# Patient Record
Sex: Male | Born: 1945
Health system: Southern US, Community
[De-identification: ages and names within clinical notes are randomized; demographics above are authoritative.]

## PROBLEM LIST (undated history)

## (undated) DIAGNOSIS — I10 Essential (primary) hypertension: Secondary | ICD-10-CM

## (undated) DIAGNOSIS — T7840XA Allergy, unspecified, initial encounter: Secondary | ICD-10-CM

## (undated) DIAGNOSIS — M1990 Unspecified osteoarthritis, unspecified site: Secondary | ICD-10-CM

## (undated) HISTORY — DX: Unspecified osteoarthritis, unspecified site: M19.90

## (undated) HISTORY — DX: Essential (primary) hypertension: I10

## (undated) HISTORY — DX: Allergy, unspecified, initial encounter: T78.40XA

---

## 2002-03-28 ENCOUNTER — Encounter: Payer: Self-pay | Admitting: Emergency Medicine

## 2002-03-28 ENCOUNTER — Emergency Department (HOSPITAL_COMMUNITY): Admission: EM | Admit: 2002-03-28 | Discharge: 2002-03-28 | Payer: Self-pay | Admitting: Emergency Medicine

## 2007-05-26 ENCOUNTER — Ambulatory Visit: Payer: Self-pay | Admitting: Gastroenterology

## 2007-05-31 ENCOUNTER — Ambulatory Visit: Payer: Self-pay | Admitting: Gastroenterology

## 2007-05-31 ENCOUNTER — Encounter: Payer: Self-pay | Admitting: Gastroenterology

## 2007-05-31 DIAGNOSIS — D126 Benign neoplasm of colon, unspecified: Secondary | ICD-10-CM | POA: Insufficient documentation

## 2007-05-31 DIAGNOSIS — K648 Other hemorrhoids: Secondary | ICD-10-CM | POA: Insufficient documentation

## 2007-06-21 DIAGNOSIS — K625 Hemorrhage of anus and rectum: Secondary | ICD-10-CM

## 2007-06-21 DIAGNOSIS — K5730 Diverticulosis of large intestine without perforation or abscess without bleeding: Secondary | ICD-10-CM | POA: Insufficient documentation

## 2007-06-21 DIAGNOSIS — E7800 Pure hypercholesterolemia, unspecified: Secondary | ICD-10-CM

## 2007-06-21 DIAGNOSIS — M129 Arthropathy, unspecified: Secondary | ICD-10-CM | POA: Insufficient documentation

## 2007-06-21 DIAGNOSIS — G4730 Sleep apnea, unspecified: Secondary | ICD-10-CM | POA: Insufficient documentation

## 2010-11-03 NOTE — Assessment & Plan Note (Signed)
[facility]                         GASTROENTEROLOGY OFFICE NOTE   EARLEY, GROBE                        MRN:          [id number]  DATE:05/26/2007                            DOB:          Aug 15, 1945    PROBLEM:  A 65 year old black male with asymptomatic rectal bleeding  with the last 5 days.   ASSESSMENT:  Dale Hopkins most likely is having hemorrhoidal bleeding, but  he does have blood and mucus from his rectal exam, suggesting a possible  rectal process.  It is of note that the patient had a negative  colonoscopy exam, except for diverticulosis in August of 2005.  He  recently had CBC by Dr. Nicholos Johns earlier this week and showed a normal  hemoglobin.   RECOMMENDATIONS:  1. Sitz baths twice a day.  2. Analpram cream twice a day, along with Anusol HC suppositories.  3. Colace 100 mg twice a day.  4. Hold aspirin and will check colonoscopy as soon as possible.  5. Continue other medications per Dr. Nicholos Johns, which include HCTZ 25 mg      a day.   HISTORY OF PRESENT ILLNESS:  Dale Hopkins has been doing well, until  earlier this week when he started having some asymptomatic bright red  blood per rectum with wiping and on his toilet paper.  He felt what  sounds like a possible prolapsed hemorrhoid, which has been reduced.  He  really denies rectal pain, abdominal pain, or any symptoms of  hypovolemia.  He denies upper GI or hepatobiliary complaints and gives  no known history of peptic ulcer disease.  Does not use NSAIDs but is on  a daily aspirin tablet.  Follows a regular diet and generally does not  have to strain to go to the bathroom.   PAST MEDICAL HISTORY:  Remarkable for hypercholesterolemia, degenerative  arthritis, and sleep apnea.   MEDICATIONS:  Aspirin 81 mg a day and HCTZ 25 mg a day.   FAMILY HISTORY:  Entirely noncontributory.   SOCIAL HISTORY:  He is married and lives with his wife.  He has a 7th  grade education, works as a  Arboriculturist at [employer].  He quite  smoking and drinking 24 years ago.   REVIEW OF SYSTEMS:  Noncontributory.   EXAMINATION:  GENERAL:  He is a healthy-appearing black male in no  distress, appearing his stated age.  He is 5' 8 and weighs 202 pounds.  VITAL SIGNS:  Blood pressure 130/86 and pulse was 68 and regular.  CHEST:  I could not appreciate stigmata of chronic liver disease.  His  chest was clear.  HEART:  He was in a regular rhythm, without murmurs, gallops or rubs.  ABDOMEN:  I could not appreciate hepatosplenomegaly, abdominal  masses  or tenderness.  Bowel sounds were normal.  PERIPHERAL:  Extremities were unremarkable.  MENTAL STATUS:  Clear.  RECTAL:  Unremarkable.  Rectal exam did show a partially thrombosed  hemorrhoid on the left lateral anal canal.  There was, however, some  blood and a mucusy substance in his rectum that was markedly guaiac-  positive.     Vania Rea. Jarold Motto, MD, Caleen Essex, FAGA  Electronically Signed    DRP/MedQ  DD: 05/26/2007  DT: 05/26/2007  Job #: [id number]

## 2012-01-14 ENCOUNTER — Telehealth: Payer: Self-pay | Admitting: Hematology and Oncology

## 2012-01-14 NOTE — Telephone Encounter (Signed)
S/w wife re appt for 8/2 @ 9:30 am.

## 2012-01-17 ENCOUNTER — Telehealth: Payer: Self-pay | Admitting: Internal Medicine

## 2012-01-17 NOTE — Telephone Encounter (Signed)
Referred by Dr. Tally Joe Dx- Thrombocytopenia. NP packet mailed out.

## 2012-01-21 ENCOUNTER — Telehealth: Payer: Self-pay | Admitting: Hematology and Oncology

## 2012-01-21 ENCOUNTER — Encounter: Payer: Self-pay | Admitting: Hematology and Oncology

## 2012-01-21 ENCOUNTER — Ambulatory Visit: Payer: BC Managed Care – PPO

## 2012-01-21 ENCOUNTER — Ambulatory Visit (HOSPITAL_BASED_OUTPATIENT_CLINIC_OR_DEPARTMENT_OTHER): Payer: BC Managed Care – PPO | Admitting: Hematology and Oncology

## 2012-01-21 ENCOUNTER — Ambulatory Visit (HOSPITAL_BASED_OUTPATIENT_CLINIC_OR_DEPARTMENT_OTHER): Payer: BC Managed Care – PPO | Admitting: Lab

## 2012-01-21 VITALS — BP 128/78 | HR 68 | Temp 98.6°F | Resp 18 | Ht 68.0 in | Wt 202.4 lb

## 2012-01-21 DIAGNOSIS — D696 Thrombocytopenia, unspecified: Secondary | ICD-10-CM

## 2012-01-21 DIAGNOSIS — D72819 Decreased white blood cell count, unspecified: Secondary | ICD-10-CM

## 2012-01-21 DIAGNOSIS — R5383 Other fatigue: Secondary | ICD-10-CM

## 2012-01-21 DIAGNOSIS — R5381 Other malaise: Secondary | ICD-10-CM

## 2012-01-21 LAB — CBC WITH DIFFERENTIAL/PLATELET
BASO%: 0.7 % (ref 0.0–2.0)
EOS%: 11.5 % — ABNORMAL HIGH (ref 0.0–7.0)
Eosinophils Absolute: 0.3 10*3/uL (ref 0.0–0.5)
LYMPH%: 55.4 % — ABNORMAL HIGH (ref 14.0–49.0)
MCH: 28.9 pg (ref 27.2–33.4)
MCHC: 33.6 g/dL (ref 32.0–36.0)
MCV: 86.1 fL (ref 79.3–98.0)
MONO%: 8 % (ref 0.0–14.0)
Platelets: 130 10*3/uL — ABNORMAL LOW (ref 140–400)
RBC: 5.01 10*6/uL (ref 4.20–5.82)
RDW: 13.8 % (ref 11.0–14.6)

## 2012-01-21 LAB — MORPHOLOGY: RBC Comments: NORMAL

## 2012-01-21 NOTE — Progress Notes (Signed)
This office note has been dictated.

## 2012-01-21 NOTE — Patient Instructions (Addendum)
Patient to follow up as instructed.   No current outpatient prescriptions on file.        August 2013  Sunday Monday Tuesday Wednesday Thursday Friday Saturday               1   2   FINANCIAL COUNSELING   9:30 AM  (30 min.)  [facility]  [facility] MEDICAL ONCOLOGY   NEW PATIENT 60  10:00 AM  (60 min.)  Laurice Record, MD  [facility] MEDICAL ONCOLOGY 3    4   5   6   7   8   9   10    11   12   13   14   15   16   17    18   19   20   21   22   23   24    25   26   27   28   29   30    31

## 2012-01-21 NOTE — Telephone Encounter (Signed)
Gave pt appt for U/S next week , Dale Hopkins aware of appt for precert. Pt will; see MD  On 8/21/ per MD

## 2012-01-21 NOTE — Progress Notes (Signed)
CC:   Dale Hopkins, M.D.  IDENTIFYING STATEMENT:  The patient is a 66 year old man seen at request of Dr. Azucena Cecil with thrombocytopenia.  HISTORY OF PRESENT ILLNESS:  The patient was noted to have declining white blood cells and platelets during recent physicals.  Review of the patient's blood work notes the following:  01/05/2012 white cell count 2.9, hemoglobin 14.5, hematocrit 44.5, platelets 124; 04/19/2011 white cell count 3.7, hemoglobin 15.5, hematocrit 46.1, platelets 129; 03/05/2010 white cell count 3.4, hemoglobin 15.1, platelets 153.  On 03/04/2009 white cell count 3.2, hemoglobin 14.9, platelets 173.  The patient has not had any new addition to his medication list.  He denies over-the-counter herbal use. He has not lost any weight.  Denies recent infections.  Denies bleeding. He denies adenopathy.  PAST MEDICAL HISTORY: 1. Sleep apnea. 2. Mixed hyperlipidemia. 3. Hypertension. 4. Allergic rhinitis. 5. Erectile dysfunction.  MEDICATIONS: 1. Aspirin 21 mg daily. 2. B12 100 mcg sublingual daily. 3. Fish oil. 4. Micardis 80/25 one tablet daily. 5. Multivitamin. 6. Viagra 50 mg daily.  ALLERGIES:  None.  SOCIAL HISTORY:  The patient is married with 4 daughters.  He was a previous cigarette smoker, quit in 1989, smoked 1 pack a day for 25 years.  Denies alcohol use.  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  FAMILY HISTORY:  Negative for oncologic or hematologic disorders.  REVIEW OF SYSTEMS:  Denies fever, chills, night sweats, anorexia, weight loss.  Cardiovascular:  Denies chest pain, PND, orthopnea, ankle swelling.  Respiration:  Denies cough, hemoptysis, wheeze, shortness of breath.  GI:  Denies nausea, vomiting, abdominal pain, diarrhea, melena, hematochezia.  GU:  Denies dysuria, hematuria, nocturia, frequency. Musculoskeletal:  Denies joint aches, muscle pains.  Neurologic:  Denies headache, vision change, extremity weakness.  Rest of  review of systems negative.  PHYSICAL EXAM:  General:  The patient is a well-appearing, well- nourished man in no distress.  Vitals:  Pulse 68, blood pressure 128/78, temperature 98.6, respirations 18, weight 202.7 pounds.  HEENT:  Head is atraumatic, normocephalic.  Sclerae anicteric.  Mouth moist.  Neck: Supple.  Chest:  Clear to percussion and auscultation.  CVS: Unremarkable.  Abdomen:  Soft, nontender.  Bowel sounds present. Extremities:  No edema.  Lymph nodes:  No palpable cervical, axillary adenopathy.  CNS:  Nonfocal.  IMPRESSION AND PLAN:  The patient is a 65 year old man with gradually declining white blood cells and platelets.  Besides a little fatigue he is asymptomatic in terms of he has had no recent infections, bruising or bleeding.  So with this said the patient will proceed by repeating a CBC with diff.  We will review morphology.  If we confirm similar results we will have him proceed with a serum protein electrophoresis.  Will review morphology.  Will obtain vitamin B12 level as he appears to be on B12 replacement therapy sublingually.  We will obtain an ultrasound of the spleen to evaluate size to rule out peripheral destructive effect.  He admits to not having had an HIV test in years.  He has agreed to have this performed.  He returns to discuss results.  I spent more than half the time coordinating care.  Will also consider a bone marrow biopsy but he will await confirmatory blood counts.    ______________________________ Laurice Record, M.D. LIO/MEDQ  D:  01/21/2012  T:  01/21/2012  Job:  [id number]

## 2012-01-21 NOTE — Progress Notes (Signed)
New patient today accompanied by his wife, patient and wife met with advocate today at this time not in need of assistance. Patient and wife were a very pleasant couple.

## 2012-01-25 ENCOUNTER — Ambulatory Visit (HOSPITAL_COMMUNITY)
Admission: RE | Admit: 2012-01-25 | Discharge: 2012-01-25 | Disposition: A | Discharge status: Home / Self Care | Payer: BC Managed Care – PPO | Source: Ambulatory Visit | Attending: Hematology and Oncology | Admitting: Hematology and Oncology

## 2012-01-25 ENCOUNTER — Other Ambulatory Visit: Payer: Self-pay | Admitting: Hematology and Oncology

## 2012-01-25 DIAGNOSIS — R5383 Other fatigue: Secondary | ICD-10-CM

## 2012-01-25 DIAGNOSIS — D696 Thrombocytopenia, unspecified: Secondary | ICD-10-CM

## 2012-01-25 DIAGNOSIS — R5381 Other malaise: Secondary | ICD-10-CM | POA: Insufficient documentation

## 2012-01-25 LAB — BASIC METABOLIC PANEL
BUN: 15 mg/dL (ref 6–23)
CO2: 26 mEq/L (ref 19–32)
Calcium: 9.1 mg/dL (ref 8.4–10.5)
Glucose, Bld: 73 mg/dL (ref 70–99)
Sodium: 141 mEq/L (ref 135–145)

## 2012-01-25 LAB — PROTEIN ELECTROPHORESIS, SERUM
Alpha-1-Globulin: 2.6 % — ABNORMAL LOW (ref 2.9–4.9)
Alpha-2-Globulin: 7.9 % (ref 7.1–11.8)
Beta Globulin: 5.6 % (ref 4.7–7.2)
Gamma Globulin: 22.2 % — ABNORMAL HIGH (ref 11.1–18.8)

## 2012-01-25 LAB — VITAMIN B12: Vitamin B-12: 1345 pg/mL — ABNORMAL HIGH (ref 211–911)

## 2012-01-25 LAB — HIV ANTIBODY (ROUTINE TESTING W REFLEX): HIV: NONREACTIVE

## 2012-02-09 ENCOUNTER — Ambulatory Visit (HOSPITAL_BASED_OUTPATIENT_CLINIC_OR_DEPARTMENT_OTHER): Payer: BC Managed Care – PPO | Admitting: Hematology and Oncology

## 2012-02-09 ENCOUNTER — Encounter: Payer: Self-pay | Admitting: Hematology and Oncology

## 2012-02-09 ENCOUNTER — Telehealth: Payer: Self-pay | Admitting: Hematology and Oncology

## 2012-02-09 VITALS — BP 120/77 | HR 72 | Temp 97.0°F | Resp 20 | Ht 68.0 in | Wt 200.9 lb

## 2012-02-09 DIAGNOSIS — D696 Thrombocytopenia, unspecified: Secondary | ICD-10-CM

## 2012-02-09 DIAGNOSIS — D72819 Decreased white blood cell count, unspecified: Secondary | ICD-10-CM

## 2012-02-09 NOTE — Progress Notes (Signed)
This office note has been dictated.

## 2012-02-09 NOTE — Progress Notes (Signed)
CC:   Tally Joe, M.D.  IDENTIFYING STATEMENT:  The patient is a 66 year old man who presents to discuss results.  INTERVAL HISTORY:  In summary, the patient was referred because of declining white blood cells and thrombocytopenia during routine physicals.  He was asymptomatic.  Had denied any recent infections or bleeding.  He also denied B-symptoms or taking over-the-counter herbal remedies.  Results note the following:  An ultrasound of the abdomen on 01/25/2012 had shown a normal sized spleen.  White cell count 2.8, hemoglobin 14.5, hematocrit 43.1, platelets 130, ANC 700.  Review of peripheral smear showed occasional variant lymphocytes, otherwise unremarkable; vitamin B12 was 1345; serum protein electrophoresis did not detect an M-spike and IFE showed a nonspecific diffuse polyclonal type; ANCA screen was negative; HIV was nonreactive.  MEDICATIONS:  Reviewed and updated.  ALLERGIES:  None.  PAST MEDICAL HISTORY/FAMILY HISTORY/SOCIAL HISTORY:  Unchanged.  REVIEW OF SYSTEMS:  A 10-point review of systems was negative.  PHYSICAL EXAMINATION:  General:  The patient is alert and oriented x3. Vitals:  Pulse 68, blood pressure 128/78, temperature 98.6, respirations 18, weight 202 pounds.  HEENT:  Head is head is atraumatic, normocephalic.  Sclerae are anicteric.  Mouth moist.  Neck:  Supple. Chest/CVS:  Unremarkable.  Abdomen:  Soft, nontender.  Bowel sounds present.  Extremities:  No edema.  LABORATORY DATA:  As above.  In addition, sodium 141, potassium 3.7, chloride 103, CO2 is 26, BUN 15, creatinine 0.83, glucose 73.  IMPRESSION AND PLAN:  The patient is a 66 year old man with leukopenia and thrombocytopenia.  Lab workup and ultrasound so far are unremarkable.  The patient needs to be investigated further with a bone marrow biopsy with aspiration.  We discussed the logistics of the procedure.  The patient is willing to proceed and will be scheduled within the next  2 weeks.  He follows up thereafter to discuss results.    ______________________________ Laurice Record, M.D. LIO/MEDQ  D:  02/09/2012  T:  02/09/2012  Job:  [id number]

## 2012-02-09 NOTE — Telephone Encounter (Signed)
appts made and printed for pt aom °

## 2012-02-09 NOTE — Patient Instructions (Signed)
Dale Hopkins  [id number]  [facility] Discharge Instructions  RECOMMENDATIONS MADE BY THE CONSULTANT AND ANY TEST RESULTS WILL BE SENT TO YOUR REFERRING DOCTOR.   EXAM FINDINGS BY MD TODAY AND SIGNS AND SYMPTOMS TO REPORT TO CLINIC OR PRIMARY MD:   Your current list of medications are: Current Outpatient Prescriptions  Medication Sig Dispense Refill  . aspirin 81 MG tablet Take 81 mg by mouth daily.      . Cyanocobalamin (VITAMIN B-12) 1000 MCG SUBL Place 1 tablet under the tongue daily.      . fish oil-omega-3 fatty acids 1000 MG capsule Take 1 g by mouth daily.      Marland Kitchen MICARDIS HCT 80-25 MG per tablet Take 1 tablet by mouth Daily.      . Multiple Vitamin (MULTIVITAMIN) tablet Take 1 tablet by mouth daily.      . sildenafil (VIAGRA) 100 MG tablet Take 50 mg by mouth daily as needed.      Marland Kitchen UNKNOWN TO PATIENT 1 tablet by Per post-pyloric tube route as needed. Sinus Aid- takes as needed for allergies- pt unsure of exact name or active ingredient         INSTRUCTIONS GIVEN AND DISCUSSED:   SPECIAL INSTRUCTIONS/FOLLOW-UP:  See above.  I acknowledge that I have been informed and understand all the instructions given to me and received a copy. I do not have any more questions at this time, but understand that I may call the [facility] at [phone] during business hours should I have any further questions or need assistance in obtaining follow-up care.

## 2012-02-17 ENCOUNTER — Other Ambulatory Visit (HOSPITAL_COMMUNITY)
Admission: RE | Admit: 2012-02-17 | Discharge: 2012-02-17 | Disposition: A | Discharge status: Home / Self Care | Payer: Medicare Other | Source: Ambulatory Visit | Attending: Hematology and Oncology | Admitting: Hematology and Oncology

## 2012-02-17 ENCOUNTER — Other Ambulatory Visit: Payer: BC Managed Care – PPO

## 2012-02-17 ENCOUNTER — Ambulatory Visit (HOSPITAL_BASED_OUTPATIENT_CLINIC_OR_DEPARTMENT_OTHER): Payer: BC Managed Care – PPO | Admitting: Hematology and Oncology

## 2012-02-17 VITALS — BP 133/83 | HR 68 | Temp 97.9°F

## 2012-02-17 DIAGNOSIS — D72819 Decreased white blood cell count, unspecified: Secondary | ICD-10-CM

## 2012-02-17 DIAGNOSIS — D696 Thrombocytopenia, unspecified: Secondary | ICD-10-CM | POA: Insufficient documentation

## 2012-02-17 DIAGNOSIS — D72822 Plasmacytosis: Secondary | ICD-10-CM | POA: Insufficient documentation

## 2012-02-17 NOTE — Progress Notes (Signed)
1000- Dressing to R bmbx site CDI.  Pt has no complaints.  Pt already given instructions to call office if any problems (bleeding uncontrolled by pressure placed to site for 20-30 minutes, numbness/tingling, severe pain), and  may take tylenol prn for pain.  Pt verbalizes understanding.

## 2012-02-17 NOTE — Procedures (Signed)
BONE MARROW PROCEDURE NOTE  Following consent, the right skin and iliac crest were cleaned and anesthestized with 2% lidocaine.  A bone marrow and biopsy were performed on separate sites of the right iliac crest.  The patient tolerated the procedure well.  The were no complications.  The area was pressure dressed.  The patient was told to keep the area clean and dry for at least 24 hours.  She was to report excessive bleeding. The reason for the bone marrow was to evaluate leucopenia.  The patient follows up to discuss results.

## 2012-03-03 ENCOUNTER — Encounter: Payer: Self-pay | Admitting: Hematology and Oncology

## 2012-03-03 ENCOUNTER — Telehealth: Payer: Self-pay | Admitting: Hematology and Oncology

## 2012-03-03 ENCOUNTER — Ambulatory Visit (HOSPITAL_BASED_OUTPATIENT_CLINIC_OR_DEPARTMENT_OTHER): Payer: BC Managed Care – PPO | Admitting: Hematology and Oncology

## 2012-03-03 ENCOUNTER — Other Ambulatory Visit (HOSPITAL_BASED_OUTPATIENT_CLINIC_OR_DEPARTMENT_OTHER): Payer: BC Managed Care – PPO | Admitting: Lab

## 2012-03-03 VITALS — BP 148/80 | HR 75 | Temp 97.1°F | Resp 20 | Ht 68.0 in | Wt 203.0 lb

## 2012-03-03 DIAGNOSIS — D72829 Elevated white blood cell count, unspecified: Secondary | ICD-10-CM

## 2012-03-03 DIAGNOSIS — D696 Thrombocytopenia, unspecified: Secondary | ICD-10-CM

## 2012-03-03 DIAGNOSIS — D72819 Decreased white blood cell count, unspecified: Secondary | ICD-10-CM

## 2012-03-03 LAB — CBC WITH DIFFERENTIAL/PLATELET
BASO%: 1 % (ref 0.0–2.0)
HCT: 40.6 % (ref 38.4–49.9)
LYMPH%: 61.7 % — ABNORMAL HIGH (ref 14.0–49.0)
MCHC: 33.1 g/dL (ref 32.0–36.0)
MCV: 86.8 fL (ref 79.3–98.0)
MONO#: 0.3 10*3/uL (ref 0.1–0.9)
MONO%: 8.1 % (ref 0.0–14.0)
NEUT%: 20.2 % — ABNORMAL LOW (ref 39.0–75.0)
Platelets: 132 10*3/uL — ABNORMAL LOW (ref 140–400)
WBC: 3.1 10*3/uL — ABNORMAL LOW (ref 4.0–10.3)

## 2012-03-03 NOTE — Patient Instructions (Addendum)
Dale Hopkins  [id number]   [facility] - AFTER VISIT SUMMARY   **RECOMMENDATIONS MADE BY THE CONSULTANT AND ANY TEST    RESULTS WILL BE SENT TO YOUR REFERRING DOCTORS.   YOUR EXAM FINDINGS, LABS AND RESULTS WERE DISCUSSED BY YOUR MD TODAY.    Your Updated drug allergies are: Allergies as of 03/03/2012  . (No Known Allergies)    Your current list of medications are: Current Outpatient Prescriptions  Medication Sig Dispense Refill  . aspirin 81 MG tablet Take 81 mg by mouth daily.      . Cyanocobalamin (VITAMIN B-12) 1000 MCG SUBL Place 1 tablet under the tongue daily.      . fish oil-omega-3 fatty acids 1000 MG capsule Take 1 g by mouth daily.      Marland Kitchen MICARDIS HCT 80-25 MG per tablet Take 1 tablet by mouth Daily.      . Multiple Vitamin (MULTIVITAMIN) tablet Take 1 tablet by mouth daily.      . sildenafil (VIAGRA) 100 MG tablet Take 50 mg by mouth daily as needed.      Marland Kitchen UNKNOWN TO PATIENT Take 1 tablet by mouth as needed. Sinus Aid- takes as needed for allergies- pt unsure of exact name or active ingredient         INSTRUCTIONS GIVEN AND DISCUSSED:  See attached schedule   SPECIAL INSTRUCTIONS/FOLLOW-UP:  See above.  I acknowledge that I have been informed and understand all the instructions given to me and received a copy. I do not have any more questions at this time, but understand that I may call the [facility] at [phone] during business hours should I have any further questions or need assistance in obtaining follow-up care.

## 2012-03-03 NOTE — Telephone Encounter (Signed)
gve the pt his June 2014 appt calendar °

## 2012-03-03 NOTE — Progress Notes (Signed)
This office note has been dictated.

## 2012-03-03 NOTE — Progress Notes (Signed)
CC:   Dale Hopkins, M.D.  IDENTIFYING STATEMENT:  The patient is a 66 year old man with moderate leukopenia and mild thrombocytopenia who presents to review recent results of bone marrow evaluation.  INTERVAL HISTORY:  The patient on his last visit had unremarkable lab and radiological studies.  Of note, the ultrasound had shown a normal sized spleen.  Peripheral smear was unremarkable.  B12 was elevated at 1345 (the patient is administering a high dose of B12).  SPEP and IFE unremarkable.  ANCA screen unremarkable.  HIV was nonreactive.  The patient then received a bone marrow biopsy with aspiration on 02/17/2012.  The marrow was hypercellular for age with erythroid and granulocytic precursors showing orderly and progressive maturation. Megakaryocytes were present with normal morphology.  Storage iron was present and ringed sideroblasts were absent.  There was mild plasmacytosis, but the plasma cells showed no large clusters or sheets.  MEDICATIONS:  Reviewed and updated.  ALLERGIES:  None.  PAST MEDICAL HISTORY/FAMILY HISTORY/SOCIAL HISTORY:  Unchanged.  REVIEW OF SYSTEMS:  Ten-point review of systems negative.  PHYSICAL EXAMINATION:  General:  The patient is a well-appearing, well- nourished man in no distress.  Vitals:  Pulse 75, blood pressure 148/80, temperature 97.1, respirations 20, weight 203 pounds.  HEENT:  Head is atraumatic, normocephalic.  Mouth moist.  Neck:  Supple.  Chest:  Clear. Abdomen:  Soft.  Extremities:  No edema.  LABORATORY DATA:  As above.  In addition, a CBC obtained today, 03/03/2012, notes a white count of 3.1 (2.8), hemoglobin 13.5, hematocrit 40.6, platelets 132 (130).  IMPRESSION AND PLAN:  Mr. Pollan is a 66 year old man with leukopenia and thrombocytopenia.  Lab evaluation included a bone marrow exam.  It was essentially unremarkable.  At this point in time, no intervention is required from a treatment standpoint and it would be reasonable  for the patient to undergo observation for a few years.  So we will have him return in 9 months' time with a CBC.  In the interim, if there are any issues or concerns, he understands he is not to hesitate to contact us.    ______________________________ Laurice Record, M.D. LIO/MEDQ  D:  03/03/2012  T:  03/03/2012  Job:  [id number]

## 2012-08-12 ENCOUNTER — Telehealth: Payer: Self-pay | Admitting: Oncology

## 2012-08-12 ENCOUNTER — Encounter: Payer: Self-pay | Admitting: Oncology

## 2012-08-12 NOTE — Telephone Encounter (Signed)
Talked to pt informed him of new MD r/s appt to june 2014 former  Dr. Dalene Carrow

## 2012-11-28 ENCOUNTER — Other Ambulatory Visit: Payer: BC Managed Care – PPO | Admitting: Lab

## 2012-11-28 ENCOUNTER — Ambulatory Visit: Payer: BC Managed Care – PPO | Admitting: Hematology and Oncology

## 2012-11-29 ENCOUNTER — Other Ambulatory Visit: Payer: Self-pay | Admitting: Oncology

## 2012-11-29 DIAGNOSIS — D696 Thrombocytopenia, unspecified: Secondary | ICD-10-CM

## 2012-11-29 DIAGNOSIS — D72819 Decreased white blood cell count, unspecified: Secondary | ICD-10-CM

## 2012-11-30 ENCOUNTER — Other Ambulatory Visit: Payer: Self-pay | Admitting: Medical Oncology

## 2012-11-30 ENCOUNTER — Other Ambulatory Visit: Payer: BC Managed Care – PPO | Admitting: Lab

## 2012-11-30 ENCOUNTER — Ambulatory Visit: Payer: BC Managed Care – PPO | Admitting: Oncology

## 2012-12-08 ENCOUNTER — Telehealth: Payer: Self-pay | Admitting: Oncology

## 2013-01-15 ENCOUNTER — Other Ambulatory Visit: Payer: BC Managed Care – PPO | Admitting: Lab

## 2013-01-15 ENCOUNTER — Ambulatory Visit: Payer: BC Managed Care – PPO | Admitting: Oncology

## 2013-08-02 ENCOUNTER — Inpatient Hospital Stay (HOSPITAL_COMMUNITY)
Admission: EM | Admit: 2013-08-02 | Discharge: 2013-08-07 | DRG: 871 | Disposition: A | Discharge status: Home Health | Payer: BC Managed Care – PPO | Attending: Internal Medicine | Admitting: Internal Medicine

## 2013-08-02 ENCOUNTER — Inpatient Hospital Stay (HOSPITAL_COMMUNITY): Payer: BC Managed Care – PPO

## 2013-08-02 ENCOUNTER — Encounter (HOSPITAL_COMMUNITY): Payer: Self-pay | Admitting: Emergency Medicine

## 2013-08-02 ENCOUNTER — Emergency Department (HOSPITAL_COMMUNITY): Payer: BC Managed Care – PPO

## 2013-08-02 DIAGNOSIS — K625 Hemorrhage of anus and rectum: Secondary | ICD-10-CM

## 2013-08-02 DIAGNOSIS — R042 Hemoptysis: Secondary | ICD-10-CM | POA: Diagnosis present

## 2013-08-02 DIAGNOSIS — N289 Disorder of kidney and ureter, unspecified: Secondary | ICD-10-CM | POA: Diagnosis present

## 2013-08-02 DIAGNOSIS — J09X2 Influenza due to identified novel influenza A virus with other respiratory manifestations: Secondary | ICD-10-CM | POA: Diagnosis present

## 2013-08-02 DIAGNOSIS — Z823 Family history of stroke: Secondary | ICD-10-CM

## 2013-08-02 DIAGNOSIS — G9349 Other encephalopathy: Secondary | ICD-10-CM | POA: Diagnosis present

## 2013-08-02 DIAGNOSIS — G4730 Sleep apnea, unspecified: Secondary | ICD-10-CM

## 2013-08-02 DIAGNOSIS — J9601 Acute respiratory failure with hypoxia: Secondary | ICD-10-CM

## 2013-08-02 DIAGNOSIS — Z87891 Personal history of nicotine dependence: Secondary | ICD-10-CM

## 2013-08-02 DIAGNOSIS — J189 Pneumonia, unspecified organism: Secondary | ICD-10-CM | POA: Diagnosis present

## 2013-08-02 DIAGNOSIS — I10 Essential (primary) hypertension: Secondary | ICD-10-CM | POA: Diagnosis present

## 2013-08-02 DIAGNOSIS — R7989 Other specified abnormal findings of blood chemistry: Secondary | ICD-10-CM

## 2013-08-02 DIAGNOSIS — D126 Benign neoplasm of colon, unspecified: Secondary | ICD-10-CM

## 2013-08-02 DIAGNOSIS — D72819 Decreased white blood cell count, unspecified: Secondary | ICD-10-CM | POA: Diagnosis present

## 2013-08-02 DIAGNOSIS — E876 Hypokalemia: Secondary | ICD-10-CM | POA: Diagnosis present

## 2013-08-02 DIAGNOSIS — Z8249 Family history of ischemic heart disease and other diseases of the circulatory system: Secondary | ICD-10-CM

## 2013-08-02 DIAGNOSIS — A419 Sepsis, unspecified organism: Principal | ICD-10-CM | POA: Diagnosis present

## 2013-08-02 DIAGNOSIS — Z7982 Long term (current) use of aspirin: Secondary | ICD-10-CM

## 2013-08-02 DIAGNOSIS — Z79899 Other long term (current) drug therapy: Secondary | ICD-10-CM

## 2013-08-02 DIAGNOSIS — J9600 Acute respiratory failure, unspecified whether with hypoxia or hypercapnia: Secondary | ICD-10-CM | POA: Diagnosis present

## 2013-08-02 DIAGNOSIS — E7800 Pure hypercholesterolemia, unspecified: Secondary | ICD-10-CM

## 2013-08-02 DIAGNOSIS — D696 Thrombocytopenia, unspecified: Secondary | ICD-10-CM | POA: Diagnosis present

## 2013-08-02 DIAGNOSIS — K5730 Diverticulosis of large intestine without perforation or abscess without bleeding: Secondary | ICD-10-CM

## 2013-08-02 DIAGNOSIS — K648 Other hemorrhoids: Secondary | ICD-10-CM

## 2013-08-02 DIAGNOSIS — M129 Arthropathy, unspecified: Secondary | ICD-10-CM

## 2013-08-02 DIAGNOSIS — R6520 Severe sepsis without septic shock: Secondary | ICD-10-CM

## 2013-08-02 DIAGNOSIS — J1100 Influenza due to unidentified influenza virus with unspecified type of pneumonia: Secondary | ICD-10-CM | POA: Diagnosis present

## 2013-08-02 LAB — LACTIC ACID, PLASMA: Lactic Acid, Venous: 3.2 mmol/L — ABNORMAL HIGH (ref 0.5–2.2)

## 2013-08-02 LAB — COMPREHENSIVE METABOLIC PANEL
ALT: 8 U/L (ref 0–53)
AST: 21 U/L (ref 0–37)
Albumin: 2.9 g/dL — ABNORMAL LOW (ref 3.5–5.2)
Alkaline Phosphatase: 32 U/L — ABNORMAL LOW (ref 39–117)
BUN: 19 mg/dL (ref 6–23)
CALCIUM: 7.1 mg/dL — AB (ref 8.4–10.5)
CO2: 22 mEq/L (ref 19–32)
Chloride: 109 mEq/L (ref 96–112)
Creatinine, Ser: 1.14 mg/dL (ref 0.50–1.35)
GFR calc Af Amer: 75 mL/min — ABNORMAL LOW (ref >90)
GFR calc non Af Amer: 65 mL/min — ABNORMAL LOW (ref >90)
Glucose, Bld: 86 mg/dL (ref 70–99)
Potassium: 3.4 mEq/L — ABNORMAL LOW (ref 3.7–5.3)
SODIUM: 144 meq/L (ref 137–147)
TOTAL PROTEIN: 6.2 g/dL (ref 6.0–8.3)
Total Bilirubin: 0.8 mg/dL (ref 0.3–1.2)

## 2013-08-02 LAB — CBC
HCT: 43.1 % (ref 39.0–52.0)
HEMATOCRIT: 38.1 % — AB (ref 39.0–52.0)
HEMOGLOBIN: 13.3 g/dL (ref 13.0–17.0)
Hemoglobin: 15.4 g/dL (ref 13.0–17.0)
MCH: 29.6 pg (ref 26.0–34.0)
MCH: 28.9 pg (ref 26.0–34.0)
MCHC: 35.7 g/dL (ref 30.0–36.0)
MCHC: 34.9 g/dL (ref 30.0–36.0)
MCV: 82.9 fL (ref 78.0–100.0)
MCV: 82.6 fL (ref 78.0–100.0)
Platelets: 129 10*3/uL — ABNORMAL LOW (ref 150–400)
Platelets: 89 10*3/uL — ABNORMAL LOW (ref 150–400)
RBC: 5.2 MIL/uL (ref 4.22–5.81)
RBC: 4.61 MIL/uL (ref 4.22–5.81)
RDW: 12.8 % (ref 11.5–15.5)
RDW: 12.8 % (ref 11.5–15.5)
WBC: 3.9 10*3/uL — ABNORMAL LOW (ref 4.0–10.5)
WBC: 2 10*3/uL — ABNORMAL LOW (ref 4.0–10.5)

## 2013-08-02 LAB — BASIC METABOLIC PANEL
BUN: 19 mg/dL (ref 6–23)
BUN: 19 mg/dL (ref 6–23)
BUN: 19 mg/dL (ref 6–23)
CALCIUM: 7 mg/dL — AB (ref 8.4–10.5)
CO2: 23 mEq/L (ref 19–32)
CO2: 21 mEq/L (ref 19–32)
CO2: 22 mEq/L (ref 19–32)
CREATININE: 1.2 mg/dL (ref 0.50–1.35)
CREATININE: 1.17 mg/dL (ref 0.50–1.35)
Calcium: 8.4 mg/dL (ref 8.4–10.5)
Calcium: 7.8 mg/dL — ABNORMAL LOW (ref 8.4–10.5)
Chloride: 101 mEq/L (ref 96–112)
Chloride: 103 mEq/L (ref 96–112)
Chloride: 108 mEq/L (ref 96–112)
Creatinine, Ser: 1.22 mg/dL (ref 0.50–1.35)
GFR calc Af Amer: 69 mL/min — ABNORMAL LOW (ref >90)
GFR calc non Af Amer: 60 mL/min — ABNORMAL LOW (ref >90)
GFR calc non Af Amer: 61 mL/min — ABNORMAL LOW (ref >90)
GFR, EST AFRICAN AMERICAN: 71 mL/min — AB (ref >90)
GFR, EST AFRICAN AMERICAN: 73 mL/min — AB (ref >90)
GFR, EST NON AFRICAN AMERICAN: 63 mL/min — AB (ref >90)
GLUCOSE: 86 mg/dL (ref 70–99)
Glucose, Bld: 97 mg/dL (ref 70–99)
Glucose, Bld: 126 mg/dL — ABNORMAL HIGH (ref 70–99)
POTASSIUM: 3.1 meq/L — AB (ref 3.7–5.3)
Potassium: 5.5 mEq/L — ABNORMAL HIGH (ref 3.7–5.3)
Potassium: 3.3 mEq/L — ABNORMAL LOW (ref 3.7–5.3)
Sodium: 141 mEq/L (ref 137–147)
Sodium: 140 mEq/L (ref 137–147)
Sodium: 143 mEq/L (ref 137–147)

## 2013-08-02 LAB — PROCALCITONIN: Procalcitonin: 18.36 ng/mL

## 2013-08-02 LAB — CG4 I-STAT (LACTIC ACID): Lactic Acid, Venous: 4.36 mmol/L — ABNORMAL HIGH (ref 0.5–2.2)

## 2013-08-02 LAB — URINALYSIS, ROUTINE W REFLEX MICROSCOPIC
Bilirubin Urine: NEGATIVE
Glucose, UA: NEGATIVE mg/dL
Hgb urine dipstick: NEGATIVE
Ketones, ur: NEGATIVE mg/dL
Leukocytes, UA: NEGATIVE
Nitrite: NEGATIVE
Protein, ur: NEGATIVE mg/dL
Specific Gravity, Urine: 1.017 (ref 1.005–1.030)
Urobilinogen, UA: 0.2 mg/dL (ref 0.0–1.0)
pH: 5.5 (ref 5.0–8.0)

## 2013-08-02 LAB — DIFFERENTIAL
Basophils Absolute: 0 10*3/uL (ref 0.0–0.1)
Basophils Relative: 0 % (ref 0–1)
Eosinophils Absolute: 0 10*3/uL (ref 0.0–0.7)
Eosinophils Relative: 0 % (ref 0–5)
Lymphocytes Relative: 16 % (ref 12–46)
Lymphs Abs: 0.6 10*3/uL — ABNORMAL LOW (ref 0.7–4.0)
Monocytes Absolute: 0.4 10*3/uL (ref 0.1–1.0)
Monocytes Relative: 12 % (ref 3–12)
Neutro Abs: 2.6 10*3/uL (ref 1.7–7.7)
Neutrophils Relative %: 72 % (ref 43–77)
WBC Morphology: INCREASED

## 2013-08-02 LAB — CARBOXYHEMOGLOBIN
CARBOXYHEMOGLOBIN: 1 % (ref 0.5–1.5)
Methemoglobin: 0.6 % (ref 0.0–1.5)
O2 SAT: 79.8 %
Total hemoglobin: 13 g/dL — ABNORMAL LOW (ref 13.5–18.0)

## 2013-08-02 LAB — POCT I-STAT 3, ART BLOOD GAS (G3+)
ACID-BASE DEFICIT: 3 mmol/L — AB (ref 0.0–2.0)
BICARBONATE: 21.8 meq/L (ref 20.0–24.0)
O2 SAT: 100 %
PO2 ART: 207 mmHg — AB (ref 80.0–100.0)
TCO2: 23 mmol/L (ref 0–100)
pCO2 arterial: 37.9 mmHg (ref 35.0–45.0)
pH, Arterial: 7.368 (ref 7.350–7.450)

## 2013-08-02 LAB — PROTIME-INR
INR: 1.51 — ABNORMAL HIGH (ref 0.00–1.49)
PROTHROMBIN TIME: 17.8 s — AB (ref 11.6–15.2)

## 2013-08-02 LAB — MRSA PCR SCREENING: MRSA BY PCR: NEGATIVE

## 2013-08-02 LAB — TYPE AND SCREEN
ABO/RH(D): B POS
ANTIBODY SCREEN: NEGATIVE

## 2013-08-02 LAB — ABO/RH: ABO/RH(D): B POS

## 2013-08-02 LAB — POCT I-STAT TROPONIN I: Troponin i, poc: 0.04 ng/mL (ref 0.00–0.08)

## 2013-08-02 LAB — CORTISOL: Cortisol, Plasma: 38.6 ug/dL

## 2013-08-02 MED ORDER — SODIUM CHLORIDE 0.9 % IV BOLUS (SEPSIS)
1000.0000 mL | Freq: Once | INTRAVENOUS | Status: AC
  Administered 2013-08-02: 1000 mL via INTRAVENOUS

## 2013-08-02 MED ORDER — SODIUM CHLORIDE 0.9 % IV BOLUS (SEPSIS): 1000.0000 mL | Freq: Once | INTRAVENOUS | Status: DC

## 2013-08-02 MED ORDER — ALBUTEROL SULFATE (2.5 MG/3ML) 0.083% IN NEBU
5.0000 mg | INHALATION_SOLUTION | Freq: Once | RESPIRATORY_TRACT | Status: AC
  Administered 2013-08-02: 5 mg via RESPIRATORY_TRACT
  Filled 2013-08-02: qty 6

## 2013-08-02 MED ORDER — SODIUM CHLORIDE 0.9 % IV SOLN: INTRAVENOUS | Status: DC

## 2013-08-02 MED ORDER — FUROSEMIDE 10 MG/ML IJ SOLN
40.0000 mg | Freq: Once | INTRAMUSCULAR | Status: AC
  Administered 2013-08-02: 40 mg via INTRAVENOUS
  Filled 2013-08-02: qty 4

## 2013-08-02 MED ORDER — IPRATROPIUM BROMIDE 0.02 % IN SOLN
0.5000 mg | Freq: Once | RESPIRATORY_TRACT | Status: AC
  Administered 2013-08-02: 0.5 mg via RESPIRATORY_TRACT
  Filled 2013-08-02: qty 2.5

## 2013-08-02 MED ORDER — PIPERACILLIN-TAZOBACTAM 3.375 G IVPB 30 MIN
3.3750 g | Freq: Once | INTRAVENOUS | Status: AC
  Administered 2013-08-02: 3.375 g via INTRAVENOUS
  Filled 2013-08-02: qty 50

## 2013-08-02 MED ORDER — LEVOFLOXACIN IN D5W 750 MG/150ML IV SOLN
750.0000 mg | INTRAVENOUS | Status: DC
  Administered 2013-08-02 – 2013-08-03 (×2): 750 mg via INTRAVENOUS
  Filled 2013-08-02 (×3): qty 150

## 2013-08-02 MED ORDER — VANCOMYCIN HCL IN DEXTROSE 1-5 GM/200ML-% IV SOLN
1000.0000 mg | Freq: Two times a day (BID) | INTRAVENOUS | Status: DC
  Administered 2013-08-02 – 2013-08-03 (×2): 1000 mg via INTRAVENOUS
  Filled 2013-08-02 (×3): qty 200

## 2013-08-02 MED ORDER — HEPARIN SODIUM (PORCINE) 5000 UNIT/ML IJ SOLN
5000.0000 [IU] | Freq: Three times a day (TID) | INTRAMUSCULAR | Status: DC
  Administered 2013-08-02 – 2013-08-03 (×3): 5000 [IU] via SUBCUTANEOUS
  Filled 2013-08-02 (×6): qty 1

## 2013-08-02 MED ORDER — SODIUM CHLORIDE 0.9 % IV SOLN
3.0000 g | Freq: Four times a day (QID) | INTRAVENOUS | Status: DC
  Filled 2013-08-02 (×2): qty 3

## 2013-08-02 MED ORDER — OSELTAMIVIR PHOSPHATE 75 MG PO CAPS
150.0000 mg | ORAL_CAPSULE | Freq: Every day | ORAL | Status: DC
  Administered 2013-08-02 – 2013-08-03 (×2): 150 mg via ORAL
  Filled 2013-08-02 (×2): qty 2

## 2013-08-02 MED ORDER — VANCOMYCIN HCL IN DEXTROSE 1-5 GM/200ML-% IV SOLN
1000.0000 mg | Freq: Once | INTRAVENOUS | Status: AC
  Administered 2013-08-02: 1000 mg via INTRAVENOUS
  Filled 2013-08-02: qty 200

## 2013-08-02 MED ORDER — ASPIRIN 300 MG RE SUPP: 300.0000 mg | RECTAL | Status: AC

## 2013-08-02 MED ORDER — SODIUM CHLORIDE 0.9 % IV SOLN
INTRAVENOUS | Status: DC
  Administered 2013-08-02: 10:00:00 via INTRAVENOUS

## 2013-08-02 MED ORDER — POLYVINYL ALCOHOL 1.4 % OP SOLN
1.0000 [drp] | OPHTHALMIC | Status: DC | PRN
  Administered 2013-08-02: 1 [drp] via OPHTHALMIC
  Filled 2013-08-02: qty 15

## 2013-08-02 MED ORDER — SODIUM CHLORIDE 0.9 % IV SOLN: 250.0000 mL | INTRAVENOUS | Status: DC | PRN

## 2013-08-02 MED ORDER — OXYCODONE HCL 5 MG PO TABS
5.0000 mg | ORAL_TABLET | Freq: Four times a day (QID) | ORAL | Status: DC | PRN
  Administered 2013-08-02 – 2013-08-04 (×5): 5 mg via ORAL
  Filled 2013-08-02 (×5): qty 1

## 2013-08-02 MED ORDER — SODIUM CHLORIDE 0.9 % IV SOLN: Freq: Once | INTRAVENOUS | Status: DC

## 2013-08-02 MED ORDER — SODIUM CHLORIDE 0.9 % IV BOLUS (SEPSIS)
1000.0000 mL | Freq: Once | INTRAVENOUS | Status: AC
Start: 2013-08-02 — End: 2013-08-02
  Administered 2013-08-02: 1000 mL via INTRAVENOUS

## 2013-08-02 MED ORDER — ACETAMINOPHEN 325 MG PO TABS
650.0000 mg | ORAL_TABLET | Freq: Once | ORAL | Status: AC
  Administered 2013-08-02: 650 mg via ORAL
  Filled 2013-08-02: qty 2

## 2013-08-02 MED ORDER — SODIUM CHLORIDE 0.9 % IV BOLUS (SEPSIS)
1000.0000 mL | INTRAVENOUS | Status: DC | PRN
  Administered 2013-08-02: 1000 mL via INTRAVENOUS

## 2013-08-02 MED ORDER — DEXTROSE 5 % IV SOLN
1.0000 g | INTRAVENOUS | Status: DC
  Administered 2013-08-02 – 2013-08-03 (×2): 1 g via INTRAVENOUS
  Filled 2013-08-02 (×3): qty 10

## 2013-08-02 MED ORDER — ACETAMINOPHEN 325 MG PO TABS
650.0000 mg | ORAL_TABLET | Freq: Four times a day (QID) | ORAL | Status: DC | PRN
  Administered 2013-08-02 – 2013-08-07 (×7): 650 mg via ORAL
  Filled 2013-08-02 (×7): qty 2

## 2013-08-02 MED ORDER — ASPIRIN 81 MG PO CHEW
324.0000 mg | CHEWABLE_TABLET | ORAL | Status: AC
  Administered 2013-08-02: 324 mg via ORAL
  Filled 2013-08-02: qty 4

## 2013-08-02 NOTE — ED Notes (Signed)
Pt reports fever, productive cough, wheezing since Sunday. Pt also reports diarrhea.

## 2013-08-02 NOTE — H&P (Signed)
Name: Dale Hopkins MRN: [id number] DOB: [birth year]    ADMISSION DATE:  08/02/2013 CONSULTATION DATE:  08/02/2013  REFERRING MD : EDP PRIMARY SERVICE: PCCM  CHIEF COMPLAINT:  SOB  BRIEF PATIENT DESCRIPTION: 68 yo male presented in acute respiratory failure after flu-like symptoms x 4 days. Resp failure, occult sepsis. PCCM asked to see.   SIGNIFICANT EVENTS / STUDIES:  2/12 admitted to [facility] ICU  LINES / TUBES: PIV Left ij 2/12>>>  CULTURES: Blood 2/12 >>> Urine 2/12 >>> Sputum 2/12 >>> Resp Viral 2/12 >>>  ANTIBIOTICS: Rocephin 2/12 >>> Levaquin 2/12 >>> Vanc 2/12 >>> Tamiflu 2/12 >>>  HISTORY OF PRESENT ILLNESS:  68 year old male with PMH significant only for HTN presents to [facility] ED 2/12 after flu-like symptoms x 4 days. Started with sinus congestion, fevers, muscle aches, and eventually dyspnea. Also c/o hemoptysis. Got much worse evening of 2/11 and his wife called EMS the morning of 2/12. Required BiPAP in the ED, PCCM asked to see.   PAST MEDICAL HISTORY :  Past Medical History  Diagnosis Date  . Allergy   . Arthritis   . Cataract   . Hypertension    History reviewed. No pertinent past surgical history. Prior to Admission medications   Medication Sig Start Date End Date Taking? Authorizing Provider  acetaminophen (TYLENOL) 325 MG tablet Take 650 mg by mouth every 6 (six) hours as needed (pain).   Yes Historical Provider, MD  aspirin 81 MG tablet Take 81 mg by mouth daily.   Yes Historical Provider, MD  fish oil-omega-3 fatty acids 1000 MG capsule Take 1 g by mouth daily.   Yes Historical Provider, MD  fluticasone (FLONASE) 50 MCG/ACT nasal spray Place 2 sprays into both nostrils daily. 06/08/13  Yes Historical Provider, MD  Multiple Vitamin (MULTIVITAMIN) tablet Take 1 tablet by mouth daily.   Yes Historical Provider, MD  sildenafil (VIAGRA) 100 MG tablet Take 50 mg by mouth daily as needed.   Yes Historical Provider, MD  telmisartan (MICARDIS) 40 MG tablet Take  40 mg by mouth daily. 07/23/13  Yes Historical Provider, MD   No Known Allergies  FAMILY HISTORY:  Family History  Problem Relation Age of Onset  . Hypertension Mother   . Hypertension Father   . Stroke Father    SOCIAL HISTORY:  reports that he quit smoking about 29 years ago. He does not have any smokeless tobacco history on file. He reports that he does not drink alcohol. His drug history is not on file.  REVIEW OF SYSTEMS:     Bolds are positive  Constitutional: weight loss, gain, night sweats, Fevers, chills, fatigue .  HEENT: headaches, Sore throat, sneezing, nasal congestion, post nasal drip, Difficulty swallowing, Tooth/dental problems, visual complaints visual changes, ear ache CV:  chest pain, radiates: ,Orthopnea, PND, swelling in lower extremities, dizziness, palpitations, syncope.  GI  heartburn, indigestion, abdominal pain, nausea, vomiting, diarrhea, change in bowel habits, loss of appetite, bloody stools.  Resp: cough, productive: , hemoptysis, dyspnea, chest pain, pleuritic.  Skin: rash or itching or icterus GU: dysuria, change in color of urine, urgency or frequency. flank pain, hematuria  MS: joint pain or swelling. decreased range of motion  Psych: change in mood or affect. depression or anxiety.  Neuro: difficulty with speech, weakness, numbness, ataxia    SUBJECTIVE:   VITAL SIGNS: Temp:  [99 F (37.2 C)-103.4 F (39.7 C)] 103.4 F (39.7 C) (02/12 0839) Pulse Rate:  [119-130] 119 (02/12 1015) Resp:  [  21-40] 40 (02/12 1015) BP: (95-142)/(59-77) 119/59 mmHg (02/12 1015) SpO2:  [86 %-99 %] 91 % (02/12 1015) Weight:  [79.379 kg (175 lb)] 79.379 kg (175 lb) (02/12 0807) HEMODYNAMICS:   VENTILATOR SETTINGS:   INTAKE / OUTPUT: Intake/Output   None     PHYSICAL EXAMINATION: General:  Male of normal body habitus on comfortable on BiPAP, mod distress intermittent Neuro:  Alert, oriented HEENT:  [place]/AT, no JVD noted Cardiovascular:  Tachy Lungs:  No  wheeze Abdomen:  Soft, non-tender, non-distended Musculoskeletal:  Intact Skin:  Intact  LABS:  CBC  Recent Labs Lab 08/02/13 0822  WBC 3.9*  HGB 15.4  HCT 43.1  PLT 129*   Coag's No results found for this basename: APTT, INR,  in the last 168 hours BMET  Recent Labs Lab 08/02/13 0822  NA 141  K 5.5*  CL 101  CO2 23  BUN 19  CREATININE 1.22  GLUCOSE 97   Electrolytes  Recent Labs Lab 08/02/13 0822  CALCIUM 8.4   Sepsis Markers  Recent Labs Lab 08/02/13 0835  LATICACIDVEN 4.36*   ABG No results found for this basename: PHART, PCO2ART, PO2ART,  in the last 168 hours Liver Enzymes No results found for this basename: AST, ALT, ALKPHOS, BILITOT, ALBUMIN,  in the last 168 hours Cardiac Enzymes No results found for this basename: TROPONINI, PROBNP,  in the last 168 hours Glucose No results found for this basename: GLUCAP,  in the last 168 hours  Imaging Dg Chest Portable 1 View  08/02/2013   CLINICAL DATA:  Fever.  Cough.  EXAM: PORTABLE CHEST - 1 VIEW  COMPARISON:  None.  FINDINGS: Abnormal right infrahilar and right middle lobe density obscures the right cardiac border. Mild cardiomegaly. Mildly indistinct pulmonary vasculature. No pleural effusion identified.  IMPRESSION: 1. Abnormal right infrahilar opacity probably reflects pneumonia given the patient's history of fever and cough. Followup chest radiography is recommended in 4 weeks time to ensure resolution and exclude underlying malignancy. 2. Mild cardiomegaly with suspected low-level pulmonary venous hypertension.   Electronically Signed   By: Sherryl Barters M.D.   On: 08/02/2013 09:00    CXR: 2/12 RML opacity, hilar, hazy LLL  ASSESSMENT / PLAN:  PULMONARY A: Acute Respiratory Failure CAP  ? Influenza Hemoptysis  P:   BiPAP PRN, low threshold for intubation.  likely need to schedule NIMV, 4 hrs on 4 hrs off ABG Supplemental O2  IS and FV when able F/u CXR in am May CT chest during  admission for hemoptysis, likely infectious  CARDIOVASCULAR A:  Occult Septic Shock ST P:  Insert CVL Trend CVP Continuous BP monitoring with low threshold for pressors.  Check Lactate trend Cortisol tsh Aggressive IVF resuscitation   RENAL A:   Acute Renal Insufficiency  P:   IVF Trend BMP in pm after further volume  GASTROINTESTINAL A:   No Acute Issue  P:   Gi PPX  Npo lft  HEMATOLOGIC A:   DVT prevention   P:  Trend CBC  INFECTIOUS A:   Severe Sepsis PNA R/o flu plus superinfection  P:   Rocephin, levaquin ,vanc STAT Tamiflu, follow renal fxn closely (wife had flu 1 week ago) F/u cultures Trend WBC and Fever Curve Leg ag  ENDOCRINE A:   No acute issues  P:   Supportive Care  NEUROLOGIC A:  Acute Encephalopathy in setting of septic shock P:   Supportive Care May need prn fent with NIMV  TODAY'S SUMMARY: Admitted to ICU for  management of acute respiratory failure. Will insert CVL and Art line. Low threshold to intubate, will likely get worse before better.   I have personally obtained a history, examined the patient, evaluated laboratory and imaging results, formulated the assessment and plan and placed orders. CRITICAL CARE: The patient is critically ill with multiple organ systems failure and requires high complexity decision making for assessment and support, frequent evaluation and titration of therapies, application of advanced monitoring technologies and extensive interpretation of multiple databases. Critical Care Time devoted to patient care services described in this note is 60 minutes.   Lavon Paganini. Titus Mould, MD, FACP Pgr: [place], ACNP [facility]/Critical Care Pager [phone] or [phone]  08/02/2013, 10:38 AM

## 2013-08-02 NOTE — Procedures (Signed)
PROCEDURE:  Internal jugular central venous catheter, U/S guided.  INDICATION: monitor hemodynamic status, occult septic shock  PROCEDURE OPERATOR: Georgann Housekeeper, NP and Merrie Roof, MD  CONSENT: obtained by patient  PROCEDURE SUMMARY:  A time-out was performed. The patient's <LEFT> neck region was prepped and draped in sterile fashion using chlorhexidine scrub. Anesthesia was achieved with 1% lidocaine. The <LEFT> internal jugular vein was accessed under ultrasound guidance using a finder needle and sheath. U/S images were permanently documented. Venous blood was withdrawn and the sheath was advanced into the vein and the needle was withdrawn. A guidewire was advanced through the sheath. A small incision was made with a 10 blade scalpel and the sheath was exchanged for a dilator over the guidewire until appropriate dilation was obtained. The dilator was removed and an 8.5 [place] central venous quad-lumen catheter was advanced over the guidewire and secured into place with 4 sutures. At time of procedure completion, all ports aspirated and flushed properly. Post-procedure x-ray shows the tip of the catheter within the superior vena cava.  COMPLICATIONS: none  ESTIMATED BLOOD LOSS: 1-2cc   [place]  Consent pt and daughters  Lavon Paganini. Titus Mould, MD, [place] Pgr: [facility]]

## 2013-08-02 NOTE — Progress Notes (Addendum)
ANTIBIOTIC CONSULT NOTE - INITIAL  Pharmacy Consult for vancomycin and Unasyn Indication: rule out sepsis  No Known Allergies  Patient Measurements: Height: 5\' 8"  (172.7 cm) Weight: 175 lb (79.379 kg) IBW/kg (Calculated) : 68.4  Vital Signs: Temp: 103.4 F (39.7 C) (02/12 0839) Temp src: Rectal (02/12 0839) BP: 131/75 mmHg (02/12 0930) Pulse Rate: 128 (02/12 0930) Intake/Output from previous day:   Intake/Output from this shift:    Labs:  Recent Labs  08/02/13 0822  WBC 3.9*  HGB 15.4  PLT 129*  CREATININE 1.22   Estimated Creatinine Clearance: 56.8 ml/min (by C-G formula based on Cr of 1.22). No results found for this basename: VANCOTROUGH, VANCOPEAK, VANCORANDOM, GENTTROUGH, GENTPEAK, GENTRANDOM, TOBRATROUGH, TOBRAPEAK, TOBRARND, AMIKACINPEAK, AMIKACINTROU, AMIKACIN,  in the last 72 hours   Microbiology: No results found for this or any previous visit (from the past 720 hour(s)).  Medical History: Past Medical History  Diagnosis Date  . Allergy   . Arthritis   . Cataract   . Hypertension     Assessment: 35 YOM admitted with body aches and fatigue as well as cough and SOB. Also with increase in urinary frequency. WBC 3.9, lactic acid 4.36, Tmax 103.4. SCr 1.22 with est CrCL ~33mL/min. Patient has already received vancomycin 1g IV x1 as well as Zosyn 3.375gm IV x1 in the ED.  Goal of Therapy:  Vancomycin trough level 15-20 mcg/ml  Plan:  1. Vancomycin 1000mg  IV q12h 2. Unasyn 3g IV q6h 3. Follow c/s, clinical progression, LOT, renal function, and trough at [place] [facility]. Tryphena Perkovich, PharmD, BCPS Clinical Pharmacist Pager: [phone] 08/02/2013 10:30 AM  ADDENDUM: Patient transferred to [facility] and CCM switched antibiotics to ceftriaxone + Levofloxacin. To continue Vancomycin.   Plan: 1. Ceftriaxone 1g IV q24h 2. Levofloxacin 750mg  IV q24h 3. vancomycin as above  Bronc Brosseau D. Starla Deller, PharmD, BCPS Clinical Pharmacist Pager: [phone] 08/02/2013 12:38 PM

## 2013-08-02 NOTE — ED Notes (Signed)
Lactic acid shown to the primary nurse Lanelle Bal

## 2013-08-02 NOTE — ED Notes (Signed)
Critical care PA at bedside.  

## 2013-08-02 NOTE — ED Provider Notes (Signed)
CSN: [id number]     Arrival date & time 08/02/13  0755 History   First MD Initiated Contact with Patient 08/02/13 0830     Chief Complaint  Patient presents with  . Fever  . Cough     (Consider location/radiation/quality/duration/timing/severity/associated sxs/prior Treatment) HPI  68 year old male presenting with a "bad cold." Symptom onset yesterday. Patient not a particularly good historian. Generalized body aches and fatigue. Coughing and feeling short of breath. Pain in "both sides" while pointing to b/l flank.  Increased urinary frequency last night. No dysuria or hematuria. Nausea, no vomiting. Some loose stools. No bright blood per rectum or melena. No sick contacts. No intervention prior to arrival. Past history of hypertension.  Past Medical History  Diagnosis Date  . Allergy   . Arthritis   . Cataract   . Hypertension    History reviewed. No pertinent past surgical history. Family History  Problem Relation Age of Onset  . Hypertension Mother   . Hypertension Father   . Stroke Father    History  Substance Use Topics  . Smoking status: Former Smoker    Quit date: 01/21/1984  . Smokeless tobacco: Not on file  . Alcohol Use: No    Review of Systems  All systems reviewed and negative, other than as noted in HPI.   Allergies  Review of patient's allergies indicates no known allergies.  Home Medications   Current Outpatient Rx  Name  Route  Sig  Dispense  Refill  . acetaminophen (TYLENOL) 325 MG tablet   Oral   Take 650 mg by mouth every 6 (six) hours as needed (pain).         Marland Kitchen aspirin 81 MG tablet   Oral   Take 81 mg by mouth daily.         . fish oil-omega-3 fatty acids 1000 MG capsule   Oral   Take 1 g by mouth daily.         . fluticasone (FLONASE) 50 MCG/ACT nasal spray   Each Nare   Place 2 sprays into both nostrils daily.         . Multiple Vitamin (MULTIVITAMIN) tablet   Oral   Take 1 tablet by mouth daily.         .  sildenafil (VIAGRA) 100 MG tablet   Oral   Take 50 mg by mouth daily as needed.         Marland Kitchen telmisartan (MICARDIS) 40 MG tablet   Oral   Take 40 mg by mouth daily.          BP 111/71  Pulse 130  Temp(Src) 99 F (37.2 C) (Oral)  Resp 24  Ht 5' 8"  (1.727 m)  Wt 175 lb (79.379 kg)  BMI 26.61 kg/m2  SpO2 96% Physical Exam  Nursing note and vitals reviewed. Constitutional: He is oriented to person, place, and time. He appears well-developed.  Laying in bed. Appears tired.  HENT:  Head: Normocephalic and atraumatic.  Eyes: Conjunctivae are normal. Pupils are equal, round, and reactive to light. Right eye exhibits no discharge. Left eye exhibits no discharge.  Neck: Neck supple.  Cardiovascular: Normal rate, regular rhythm and normal heart sounds.  Exam reveals no gallop and no friction rub.   No murmur heard. Tachycardic. Multiple keloids on anterior chest wall.  Pulmonary/Chest: Effort normal and breath sounds normal. No respiratory distress.  Abdominal: Soft. He exhibits no distension. There is no tenderness.  Musculoskeletal: He exhibits no edema and no tenderness.  Neurological: He is alert and oriented to person, place, and time. No cranial nerve deficit. He exhibits normal muscle tone. Coordination normal.  Patient somewhat slow to respond to questioning, but answering appropriately.  Skin: Skin is warm and dry.  Psychiatric: His behavior is normal. Thought content normal.    ED Course  Procedures (including critical care time)  CRITICAL CARE Performed by: Virgel Manifold  Total critical care time: 35 minutes  Critical care time was exclusive of separately billable procedures and treating other patients. Critical care was necessary to treat or prevent imminent or life-threatening deterioration. Critical care was time spent personally by me on the following activities: development of treatment plan with patient and/or surrogate as well as nursing, discussions with  consultants, evaluation of patient's response to treatment, examination of patient, obtaining history from patient or surrogate, ordering and performing treatments and interventions, ordering and review of laboratory studies, ordering and review of radiographic studies, pulse oximetry and re-evaluation of patient's condition.  Labs Review Labs Reviewed  BASIC METABOLIC PANEL - Abnormal; Notable for the following:    Potassium 5.5 (*)    GFR calc non Af Amer 60 (*)    GFR calc Af Amer 69 (*)    All other components within normal limits  CBC - Abnormal; Notable for the following:    WBC 3.9 (*)    Platelets 129 (*)    All other components within normal limits  CG4 I-STAT (LACTIC ACID) - Abnormal; Notable for the following:    Lactic Acid, Venous 4.36 (*)    All other components within normal limits  CULTURE, BLOOD (ROUTINE X 2)  CULTURE, BLOOD (ROUTINE X 2)  URINALYSIS, ROUTINE W REFLEX MICROSCOPIC  DIFFERENTIAL  POCT I-STAT TROPONIN I   Imaging Review Dg Chest Portable 1 View  08/02/2013   CLINICAL DATA:  Fever.  Cough.  EXAM: PORTABLE CHEST - 1 VIEW  COMPARISON:  None.  FINDINGS: Abnormal right infrahilar and right middle lobe density obscures the right cardiac border. Mild cardiomegaly. Mildly indistinct pulmonary vasculature. No pleural effusion identified.  IMPRESSION: 1. Abnormal right infrahilar opacity probably reflects pneumonia given the patient's history of fever and cough. Followup chest radiography is recommended in 4 weeks time to ensure resolution and exclude underlying malignancy. 2. Mild cardiomegaly with suspected low-level pulmonary venous hypertension.   Electronically Signed   By: Sherryl Barters M.D.   On: 08/02/2013 09:00      MDM   Final diagnoses:  Severe sepsis  CAP (community acquired pneumonia)  Elevated lactic acid level   8:32 AM Patient clinically septic. Possibly pneumonia with hypoxemia and bilateral rhonchi. Consider urosepsis with increased  urinary frequency as well. Patient with significant sinus tachycardia and hypotension with some blood pressures of 90s over 50s. IVx2 established. Fluid bolus.  Empiric antibiotics. Chest x-ray, urinalysis and labs including blood cultures. Will assess fluid responsiveness of hypotension. O2 sats good with supplemental o2.  Will need admission.  10:05 AM CXR consistent with pneumonia. Lactic acid elevated. IVF. BP improved after 2L NS. Pt with no new complaints. o2 sats good on 3L but becoming more tachypneic. Will place on PPV.  CBC with mild leukopenia/thrombocytopenia. Hx of same. Previous evaluation by Hem/Onc. Prior w/u including [place] which showed normal appearing spleen, normal peripheral smear and unremarkable bone marrow biopsy. Minimal hyperK. No acute EKG changes and hemolyzed specimen. Tx deferred at this time. UA pending. Certainly sick gentleman. Initially dicussed with medicine. They would like to get CCM involved. Very reasonable.  Virgel Manifold, MD 08/06/13 [phone]

## 2013-08-02 NOTE — ED Notes (Addendum)
Pt. transported to 2H-11 on Bipap, uneventful, ABG not obtained in ED staff ready for transport.

## 2013-08-02 NOTE — Progress Notes (Signed)
MD paged about pt's increase work of breathing and breath sounds; orders received; will continue to monitor closely and update as needed

## 2013-08-02 NOTE — Progress Notes (Signed)
1453 - CVP 8; elink notified; co-ox 79.8; sepsis protocol stopped per parameters; elink aware; will continue to monitor pt closely and update as needed

## 2013-08-02 NOTE — ED Notes (Signed)
Respiratory at bedside.

## 2013-08-02 NOTE — ED Notes (Signed)
MD made aware of pts increase in respirations. Respiratory called for Bipap

## 2013-08-02 NOTE — ED Notes (Signed)
Pt brought to room; pt getting undressed and into a gown at this time; family at bedside; NT aware

## 2013-08-03 ENCOUNTER — Inpatient Hospital Stay (HOSPITAL_COMMUNITY): Payer: BC Managed Care – PPO

## 2013-08-03 DIAGNOSIS — J9601 Acute respiratory failure with hypoxia: Secondary | ICD-10-CM | POA: Diagnosis present

## 2013-08-03 LAB — CBC
HCT: 37.1 % — ABNORMAL LOW (ref 39.0–52.0)
HEMOGLOBIN: 13.2 g/dL (ref 13.0–17.0)
MCH: 29.3 pg (ref 26.0–34.0)
MCHC: 35.6 g/dL (ref 30.0–36.0)
MCV: 82.4 fL (ref 78.0–100.0)
Platelets: 89 10*3/uL — ABNORMAL LOW (ref 150–400)
RBC: 4.5 MIL/uL (ref 4.22–5.81)
RDW: 13.1 % (ref 11.5–15.5)
WBC: 3.5 10*3/uL — ABNORMAL LOW (ref 4.0–10.5)

## 2013-08-03 LAB — BASIC METABOLIC PANEL
BUN: 24 mg/dL — ABNORMAL HIGH (ref 6–23)
CALCIUM: 7.4 mg/dL — AB (ref 8.4–10.5)
CHLORIDE: 104 meq/L (ref 96–112)
CO2: 22 meq/L (ref 19–32)
Creatinine, Ser: 1.16 mg/dL (ref 0.50–1.35)
GFR calc Af Amer: 73 mL/min — ABNORMAL LOW (ref >90)
GFR calc non Af Amer: 63 mL/min — ABNORMAL LOW (ref >90)
Glucose, Bld: 69 mg/dL — ABNORMAL LOW (ref 70–99)
Potassium: 4 mEq/L (ref 3.7–5.3)
SODIUM: 142 meq/L (ref 137–147)

## 2013-08-03 LAB — PROCALCITONIN: Procalcitonin: 50.23 ng/mL

## 2013-08-03 MED ORDER — OSELTAMIVIR PHOSPHATE 75 MG PO CAPS: 75.0000 mg | ORAL_CAPSULE | Freq: Every day | ORAL | Status: DC

## 2013-08-03 MED ORDER — OSELTAMIVIR PHOSPHATE 75 MG PO CAPS
75.0000 mg | ORAL_CAPSULE | Freq: Two times a day (BID) | ORAL | Status: DC
  Administered 2013-08-04: 75 mg via ORAL
  Filled 2013-08-03 (×2): qty 1

## 2013-08-03 NOTE — Progress Notes (Signed)
BRIEF PATIENT DESCRIPTION: 68 yo male presented in acute respiratory failure after flu-like symptoms x 4 days. Resp failure, occult sepsis. PCCM asked to see.   SIGNIFICANT EVENTS / STUDIES:  2/12 admitted to ICU   LINES / TUBES:  Left ij 2/12>>   CULTURES:  Resp Viral 2/12 >>  Strep Ag 2/13 >> Leg Ag 2/13 >>  Blood 2/12 >>>     ANTIBIOTICS:  Vanc 2/12 >>> 2/13 Oseltamivir 2/12 >>  Rocephin 2/12 >>>  Levaquin 2/12 >>>   SUBJ: Tachypneic and mildly dyspneic @ rest. Speaks in full sentences. Cognition intact.   OBJDanley Danker Vitals:   08/03/13 1000 08/03/13 1100 08/03/13 1200 08/03/13 1300  BP: 141/90 159/94 147/95 136/81  Pulse: 126 125 121 115  Temp:   100.5 F (38.1 C)   TempSrc:   Oral   Resp: 28 39 38 32  Height:      Weight:      SpO2: 95% 95% 98% 98%    HEENT WNL No JVD Bronchial BS and rhonchi in R base. No wheezes Tachy, reg, no M NABS, soft Ext warm without edema   I have reviewed all of today's lab results. Relevant abnormalities are discussed in the A/P section  IMP: Principal Problem:   CAP (community acquired pneumonia) Active Problems:   Severe sepsis with acute organ dysfunction   Acute respiratory failure with hypoxia   Leucopenia   Thrombocytopenia   HTN (hypertension)  Doubt influenza based on CXR pattern Doubt resistant GPC given lack of recent hospitalization  PLAN: Cont to monitor in ICU Cont PRN NPPV Check strep and leg Ag DC Vanc If flu swab neg, DC oseltamivir Monitor CBC intermittently   Merton Border, MD ; [facility] service Mobile [phone].  After 5:30 PM or weekends, call [phone]

## 2013-08-03 NOTE — Progress Notes (Signed)
Utilization Review Completed.  

## 2013-08-04 ENCOUNTER — Inpatient Hospital Stay (HOSPITAL_COMMUNITY): Payer: BC Managed Care – PPO

## 2013-08-04 LAB — BASIC METABOLIC PANEL
BUN: 23 mg/dL (ref 6–23)
CALCIUM: 8.2 mg/dL — AB (ref 8.4–10.5)
CO2: 23 mEq/L (ref 19–32)
CREATININE: 0.94 mg/dL (ref 0.50–1.35)
Chloride: 105 mEq/L (ref 96–112)
GFR, EST NON AFRICAN AMERICAN: 85 mL/min — AB (ref >90)
GLUCOSE: 55 mg/dL — AB (ref 70–99)
Potassium: 3.6 mEq/L — ABNORMAL LOW (ref 3.7–5.3)
Sodium: 143 mEq/L (ref 137–147)

## 2013-08-04 LAB — CBC
HCT: 36.9 % — ABNORMAL LOW (ref 39.0–52.0)
Hemoglobin: 13.2 g/dL (ref 13.0–17.0)
MCH: 29.3 pg (ref 26.0–34.0)
MCHC: 35.8 g/dL (ref 30.0–36.0)
MCV: 82 fL (ref 78.0–100.0)
PLATELETS: 98 10*3/uL — AB (ref 150–400)
RBC: 4.5 MIL/uL (ref 4.22–5.81)
RDW: 13.4 % (ref 11.5–15.5)
WBC: 13.4 10*3/uL — ABNORMAL HIGH (ref 4.0–10.5)

## 2013-08-04 LAB — PROCALCITONIN: Procalcitonin: 29.97 ng/mL

## 2013-08-04 LAB — STREP PNEUMONIAE URINARY ANTIGEN: STREP PNEUMO URINARY ANTIGEN: NEGATIVE

## 2013-08-04 MED ORDER — ASPIRIN 81 MG PO TABS
81.0000 mg | ORAL_TABLET | Freq: Every day | ORAL | Status: DC
  Administered 2013-08-04 – 2013-08-05 (×2): 81 mg via ORAL
  Filled 2013-08-04 (×3): qty 1

## 2013-08-04 MED ORDER — HYDRALAZINE HCL 20 MG/ML IJ SOLN
10.0000 mg | INTRAMUSCULAR | Status: DC | PRN
  Administered 2013-08-04: 40 mg via INTRAVENOUS
  Administered 2013-08-04: 20 mg via INTRAVENOUS
  Administered 2013-08-04: 10 mg via INTRAVENOUS
  Administered 2013-08-05 (×2): 40 mg via INTRAVENOUS
  Filled 2013-08-04 (×2): qty 2
  Filled 2013-08-04: qty 1
  Filled 2013-08-04: qty 2
  Filled 2013-08-04: qty 1

## 2013-08-04 MED ORDER — ATENOLOL 12.5 MG HALF TABLET
12.5000 mg | ORAL_TABLET | Freq: Every day | ORAL | Status: DC
  Administered 2013-08-04: 12.5 mg via ORAL
  Filled 2013-08-04 (×2): qty 1

## 2013-08-04 MED ORDER — ONDANSETRON HCL 4 MG/2ML IJ SOLN
INTRAMUSCULAR | Status: AC
  Administered 2013-08-04: 4 mg
  Filled 2013-08-04: qty 2

## 2013-08-04 MED ORDER — IRBESARTAN 150 MG PO TABS
150.0000 mg | ORAL_TABLET | Freq: Every day | ORAL | Status: DC
  Administered 2013-08-04 – 2013-08-07 (×4): 150 mg via ORAL
  Filled 2013-08-04 (×4): qty 1

## 2013-08-04 MED ORDER — LEVOFLOXACIN 750 MG PO TABS
750.0000 mg | ORAL_TABLET | ORAL | Status: DC
  Administered 2013-08-04 – 2013-08-07 (×4): 750 mg via ORAL
  Filled 2013-08-04 (×4): qty 1

## 2013-08-04 MED ORDER — ASPIRIN 81 MG PO CHEW
CHEWABLE_TABLET | ORAL | Status: AC
  Administered 2013-08-04: 81 mg
  Filled 2013-08-04: qty 1

## 2013-08-04 MED ORDER — ALBUTEROL SULFATE (2.5 MG/3ML) 0.083% IN NEBU
2.5000 mg | INHALATION_SOLUTION | RESPIRATORY_TRACT | Status: DC | PRN
  Administered 2013-08-04: 2.5 mg via RESPIRATORY_TRACT
  Filled 2013-08-04: qty 3

## 2013-08-04 MED ORDER — POTASSIUM CHLORIDE 10 MEQ/100ML IV SOLN
10.0000 meq | INTRAVENOUS | Status: AC
  Administered 2013-08-04 (×4): 10 meq via INTRAVENOUS
  Filled 2013-08-04 (×4): qty 100

## 2013-08-04 NOTE — Progress Notes (Signed)
eLink Physician-Brief Progress Note Patient Name: Dale Hopkins DOB: Feb 09, 1946 MRN: [id number]  Date of Service  08/04/2013   HPI/Events of Note  HTN despite avapro and prn hydralazine.  No overt cardiac decompensation.   eICU Interventions  Start atenolol daily which appears to be a home medication for the patient   Intervention Category Intermediate Interventions: Hypertension - evaluation and management  Mauri Brooklyn, P 08/04/2013, 5:38 PM

## 2013-08-04 NOTE — Progress Notes (Signed)
eLink Physician-Brief Progress Note Patient Name: Dale Hopkins DOB: 04-22-1946 MRN: [id number]  Date of Service  08/04/2013   HPI/Events of Note  Nurse concerned about patient resp status.  Wheezing noted on exam.  Patient appears fairly comfortable sitting in chair on side of bed.  Vital signs notable for HTN.   eICU Interventions  Albuterol neb every 4 hrs as needed Continue present antihypertensive regimen. Still appears ok for transfer after breathing treatment given.      Mauri Brooklyn, P 08/04/2013, 3:23 PM

## 2013-08-04 NOTE — Progress Notes (Addendum)
BRIEF PATIENT DESCRIPTION: 68 yo male presented in acute respiratory failure after flu-like symptoms x 4 days. Resp failure, occult sepsis. PCCM asked to see.   SIGNIFICANT EVENTS / STUDIES:  2/12 admitted to ICU   LINES / TUBES:  Left ij 2/12>> 2/14  CULTURES:  Strep Ag 2/13 >> NEG Leg Ag 2/13 >> NEG Blood 2/12 >>  Resp Viral 2/12 >>   ANTIBIOTICS:  Vanc 2/12 >>> 2/13 Oseltamivir 2/12 >>  Rocephin 2/12 >>>  Levaquin 2/12 >>>   SUBJ: Improved. No new complaints. Comfortable on [place] O2  OBJ: Filed Vitals:   08/04/13 1000 08/04/13 1100 08/04/13 1200 08/04/13 1300  BP: 132/63 144/100 187/112 189/120  Pulse: 125 114 119 121  Temp:      TempSrc:      Resp: 37 30 39 30  Height:      Weight:      SpO2: 94% 92% 97% 97%    HEENT WNL No JVD Bronchial BS and rhonchi in R base. No wheezes RRR, no M NABS, soft Ext warm without edema   I have reviewed all of today's lab results. Relevant abnormalities are discussed in the A/P section   CXR: [place] R>L AS dz   IMP: Principal Problem:   CAP (community acquired pneumonia) Active Problems:   Severe sepsis with acute organ dysfunction   Acute respiratory failure with hypoxia   Leucopenia   Thrombocytopenia   HTN (hypertension)  Doubt influenza based on CXR pattern  PLAN: Transfer to Tele DC ceftriaxone Change levofloxacin to PO DC oseltamivir Resume ARB PRN hydralazine   Discussed with Dr Wynelle Cleveland. TRH to assume care as of 2/15 AM and PCCM to sign off   Merton Border, MD ; [facility] [phone].  After 5:30 PM or weekends, call [phone]

## 2013-08-04 NOTE — Progress Notes (Signed)
[place] Progress Note Patient Name: Dale Hopkins DOB: [birth year] MRN: [id number]  Date of Service  08/04/2013   HPI/Events of Note    Low potassium  eICU Interventions  replaced      Mauri Brooklyn, P 08/04/2013, 6:23 AM

## 2013-08-05 DIAGNOSIS — J9600 Acute respiratory failure, unspecified whether with hypoxia or hypercapnia: Secondary | ICD-10-CM

## 2013-08-05 DIAGNOSIS — J09X2 Influenza due to identified novel influenza A virus with other respiratory manifestations: Secondary | ICD-10-CM

## 2013-08-05 LAB — LEGIONELLA ANTIGEN, URINE: Legionella Antigen, Urine: NEGATIVE

## 2013-08-05 LAB — RESPIRATORY VIRUS PANEL
Adenovirus: NOT DETECTED
INFLUENZA A H1: NOT DETECTED
INFLUENZA B 1: NOT DETECTED
Influenza A H3: DETECTED — AB
Influenza A: DETECTED — AB
Metapneumovirus: NOT DETECTED
PARAINFLUENZA 3 A: NOT DETECTED
Parainfluenza 1: NOT DETECTED
Parainfluenza 2: NOT DETECTED
Respiratory Syncytial Virus A: NOT DETECTED
Respiratory Syncytial Virus B: NOT DETECTED
Rhinovirus: NOT DETECTED

## 2013-08-05 MED ORDER — ENSURE PUDDING PO PUDG
1.0000 | Freq: Three times a day (TID) | ORAL | Status: DC
  Administered 2013-08-05: 1 via ORAL

## 2013-08-05 MED ORDER — OSELTAMIVIR PHOSPHATE 75 MG PO CAPS
75.0000 mg | ORAL_CAPSULE | Freq: Two times a day (BID) | ORAL | Status: DC
  Administered 2013-08-05 – 2013-08-06 (×4): 75 mg via ORAL
  Filled 2013-08-05 (×6): qty 1

## 2013-08-05 MED ORDER — LABETALOL HCL 5 MG/ML IV SOLN
10.0000 mg | INTRAVENOUS | Status: DC | PRN
  Filled 2013-08-05: qty 4

## 2013-08-05 MED ORDER — LABETALOL HCL 200 MG PO TABS
200.0000 mg | ORAL_TABLET | Freq: Two times a day (BID) | ORAL | Status: DC
  Administered 2013-08-05 – 2013-08-07 (×5): 200 mg via ORAL
  Filled 2013-08-05 (×6): qty 1

## 2013-08-05 MED ORDER — AMLODIPINE BESYLATE 10 MG PO TABS
10.0000 mg | ORAL_TABLET | Freq: Every day | ORAL | Status: DC
  Administered 2013-08-05 – 2013-08-07 (×2): 10 mg via ORAL
  Filled 2013-08-05 (×3): qty 1

## 2013-08-05 NOTE — Progress Notes (Signed)
[facility] Progress Note Dale Hopkins - Stepdown/ICU TEAM   RENDER Dale Hopkins:[id number] DOB: Nov 04, 1945 DOA: 08/02/2013 PCP: Gara Kroner, MD  Brief narrative: Dale Hopkins is a 68 y.o. male presenting on 08/02/2013 with  has a past medical history of Allergy; Arthritis; Cataract; and Hypertension. who presents with flu-like symptoms x 4 days. Started with sinus congestion, fevers, muscle aches, and eventually dyspnea. Also c/o hemoptysis. Got much worse evening of 2/11 and his wife called EMS the morning of 2/12. Required BiPAP in the ED  LINES / TUBES:  Left ij 2/12>> 2/14  CULTURES:  Strep Ag 2/13 >> NEG  Leg Ag 2/13 >> NEG  Blood 2/12 >>  Resp Viral 2/12 >>   Subjective: Mild cough- very weak. No other complaints.   Assessment/Plan: Principal Problem:   Acute respiratory failure with hypoxia - CAP and Influenza A - cont Levaquin and resume Tamiflu  Active Problems:   Severe sepsis with acute organ dysfunction - due to above    Leukopenia/Thrombocytopenia - due to sepsis - now leukocytosis    HTN (hypertension) - stop IV Hydralazine due to tachycardia - start Labetalol and Norvac  Hypokalemia - replace  Code Status: full code Family Communication: wife at bedside Disposition Plan: pt eval  Consultants: none  Procedures: none  Antibiotics: Vanc 2/12 >>> 2/13  Oseltamivir 2/12 >>  Rocephin 2/12 >>>  Levaquin 2/12 >>>    DVT prophylaxis: Start Lovenox  Objective: Filed Weights   08/02/13 0807 08/02/13 1153 08/03/13 0453  Weight: 79.379 kg (175 lb) 82.3 kg (181 lb 7 oz) 85.6 kg (188 lb 11.4 oz)   Blood pressure 152/81, pulse 117, temperature 99.6 F (37.6 C), temperature source Oral, resp. rate 27, height _0  (Hopkins.727 m), weight 85.6 kg (188 lb 11.4 oz), SpO2 94.00%.  Intake/Output Summary (Last 24 hours) at 08/05/13 0911 Last data filed at 08/05/13 0600  Gross per 24 hour  Intake   1365 ml  Output    725 ml  Net    640 ml      Exam: General: AAO x 3 -No acute respiratory distress Lungs: Crackles at bases bilaterally   Cardiovascular: Regular rate and rhythm - tachycardia HR 110-120 Abdomen: Nontender, nondistended, soft, bowel sounds positive, no rebound, no ascites, no appreciable mass Extremities: No significant cyanosis, clubbing, or edema bilateral lower extremities  Data Reviewed: Basic Metabolic Panel:  Recent Labs Lab 08/02/13 0822 08/02/13 1109 08/02/13 1400 08/03/13 0440 08/04/13 0500  NA 141 140 143  144 142 143  K 5.5* 3.Hopkins* 3.3*  3.4* 4.0 3.6*  CL 101 103 108  109 104 105  CO2 _1 GLUCOSE 97 126* 86  86 69* 55*  BUN _2 24* 23  CREATININE Hopkins.22 Hopkins.20 Hopkins.17  Hopkins.14 Hopkins.16 0.94  CALCIUM 8.4 7.8* 7.0*  7.Hopkins* 7.4* 8.2*   Liver Function Tests:  Recent Labs Lab 08/02/13 1400  AST 21  ALT 8  ALKPHOS 32*  BILITOT 0.8  PROT 6.2  ALBUMIN 2.9*   No results found for this basename: LIPASE, AMYLASE,  in the last 168 hours No results found for this basename: AMMONIA,  in the last 168 hours CBC:  Recent Labs Lab 08/02/13 0822 08/02/13 1109 08/03/13 0440 08/04/13 0500  WBC 3.9* 2.0* 3.5* 13.4*  NEUTROABS 2.6  --   --   --   HGB 15.4 13.3 13.2 13.2  HCT 43.Hopkins 38.Hopkins* 37.Hopkins* 36.9*  MCV 82.9 82.6 82.4 82.0  PLT 129* 89* 89* 98*   Cardiac Enzymes: No results found for this basename: CKTOTAL, CKMB, CKMBINDEX, TROPONINI,  in the last 168 hours BNP (last 3 results) No results found for this basename: PROBNP,  in the last 8760 hours CBG: No results found for this basename: GLUCAP,  in the last 168 hours  Recent Results (from the past 240 hour(s))  CULTURE, BLOOD (ROUTINE X 2)     Status: None   Collection Time    08/02/13  8:50 AM      Result Value Ref Range Status   Specimen Description BLOOD LEFT ANTECUBITAL   Final   Special Requests     Final   Value: BOTTLES DRAWN AEROBIC AND ANAEROBIC 5MLS RED AND 10MLS BLUE   Culture  Setup Time     Final    Value: 08/02/2013 12:53     Performed at [facility]   Culture     Final   Value:        BLOOD CULTURE RECEIVED NO GROWTH TO DATE CULTURE WILL BE HELD FOR 5 DAYS BEFORE ISSUING A FINAL NEGATIVE REPORT     Performed at [facility]   Report Status PENDING   Incomplete  CULTURE, BLOOD (ROUTINE X 2)     Status: None   Collection Time    08/02/13  8:57 AM      Result Value Ref Range Status   Specimen Description BLOOD RIGHT HAND   Final   Special Requests BOTTLES DRAWN AEROBIC AND ANAEROBIC 5MLS   Final   Culture  Setup Time     Final   Value: 08/02/2013 12:53     Performed at [facility]   Culture     Final   Value:        BLOOD CULTURE RECEIVED NO GROWTH TO DATE CULTURE WILL BE HELD FOR 5 DAYS BEFORE ISSUING A FINAL NEGATIVE REPORT     Performed at [facility]   Report Status PENDING   Incomplete  RESPIRATORY VIRUS PANEL     Status: Abnormal   Collection Time    08/02/13 11:17 AM      Result Value Ref Range Status   Source - RVPAN NASAL SWAB   Corrected   Comment: CORRECTED ON 02/15 AT 1610: PREVIOUSLY REPORTED AS NASAL SWAB   Respiratory Syncytial Virus A NOT DETECTED   Final   Respiratory Syncytial Virus B NOT DETECTED   Final   Influenza A DETECTED (*)  Final   Influenza B NOT DETECTED   Final   Parainfluenza Hopkins NOT DETECTED   Final   Parainfluenza 2 NOT DETECTED   Final   Parainfluenza 3 NOT DETECTED   Final   Metapneumovirus NOT DETECTED   Final   Rhinovirus NOT DETECTED   Final   Adenovirus NOT DETECTED   Final   Influenza A H1 NOT DETECTED   Final   Influenza A H3 DETECTED (*)  Final   Comment: (NOTE)           Normal Reference Range for each Analyte: NOT DETECTED     Testing performed using the Luminex xTAG Respiratory Viral Panel test     kit.     This test was developed and its performance characteristics determined     by [facility]. It has not been cleared or approved by the [place]     Food and Drug Administration. This test  is used for  clinical purposes.     It should not be regarded as investigational or for research. This     laboratory is certified under the [place] (CLIA) as qualified to perform high complexity     clinical laboratory testing.     Performed at [facility] PCR SCREENING     Status: None   Collection Time    08/02/13  2:50 PM      Result Value Ref Range Status   MRSA by PCR NEGATIVE  NEGATIVE Final   Comment:            The GeneXpert MRSA Assay (FDA     approved for NASAL specimens     only), is one component of a     comprehensive MRSA colonization     surveillance program. It is not     intended to diagnose MRSA     infection nor to guide or     monitor treatment for     MRSA infections.     Studies:  Recent x-ray studies have been reviewed in detail by the Attending Physician  Scheduled Meds:  Scheduled Meds: . aspirin  81 mg Oral Daily  . atenolol  12.5 mg Oral Daily  . irbesartan  150 mg Oral Daily  . levofloxacin  750 mg Oral Q24H   Continuous Infusions:   Time spent on care of this patient: 45 min   Debbe Odea, MD  [facility] Office  [phone] Pager - Text Page per Shea Evans as per below:  On-Call/Text Page:      [email or web address]      password [email or web address]  If 7PM-7AM, please contact night-coverage [email or web address] Password [email or web address] 08/05/2013, 9:11 AM   LOS: 3 days

## 2013-08-06 LAB — BASIC METABOLIC PANEL
BUN: 27 mg/dL — ABNORMAL HIGH (ref 6–23)
CALCIUM: 9 mg/dL (ref 8.4–10.5)
CO2: 25 mEq/L (ref 19–32)
Chloride: 104 mEq/L (ref 96–112)
Creatinine, Ser: 1.01 mg/dL (ref 0.50–1.35)
GFR, EST AFRICAN AMERICAN: 87 mL/min — AB (ref >90)
GFR, EST NON AFRICAN AMERICAN: 75 mL/min — AB (ref >90)
Glucose, Bld: 93 mg/dL (ref 70–99)
POTASSIUM: 3.4 meq/L — AB (ref 3.7–5.3)
SODIUM: 142 meq/L (ref 137–147)

## 2013-08-06 LAB — CBC
HCT: 36.7 % — ABNORMAL LOW (ref 39.0–52.0)
HEMOGLOBIN: 12.8 g/dL — AB (ref 13.0–17.0)
MCH: 28.7 pg (ref 26.0–34.0)
MCHC: 34.9 g/dL (ref 30.0–36.0)
MCV: 82.3 fL (ref 78.0–100.0)
PLATELETS: 122 10*3/uL — AB (ref 150–400)
RBC: 4.46 MIL/uL (ref 4.22–5.81)
RDW: 14 % (ref 11.5–15.5)
WBC: 17.6 10*3/uL — ABNORMAL HIGH (ref 4.0–10.5)

## 2013-08-06 MED ORDER — ASPIRIN EC 81 MG PO TBEC
81.0000 mg | DELAYED_RELEASE_TABLET | Freq: Every day | ORAL | Status: DC
  Administered 2013-08-06 – 2013-08-07 (×2): 81 mg via ORAL
  Filled 2013-08-06 (×2): qty 1

## 2013-08-06 MED ORDER — POTASSIUM CHLORIDE CRYS ER 20 MEQ PO TBCR
40.0000 meq | EXTENDED_RELEASE_TABLET | Freq: Once | ORAL | Status: AC
  Administered 2013-08-06: 40 meq via ORAL
  Filled 2013-08-06: qty 2

## 2013-08-06 MED ORDER — ENSURE COMPLETE PO LIQD
237.0000 mL | Freq: Two times a day (BID) | ORAL | Status: DC
  Administered 2013-08-06 – 2013-08-07 (×3): 237 mL via ORAL

## 2013-08-06 MED ORDER — FLUTICASONE PROPIONATE 50 MCG/ACT NA SUSP
2.0000 | Freq: Every day | NASAL | Status: DC
  Administered 2013-08-06 – 2013-08-07 (×2): 2 via NASAL
  Filled 2013-08-06: qty 16

## 2013-08-06 MED ORDER — OXYMETAZOLINE HCL 0.05 % NA SOLN
1.0000 | Freq: Two times a day (BID) | NASAL | Status: DC
  Administered 2013-08-06: 1 via NASAL
  Filled 2013-08-06: qty 15

## 2013-08-06 NOTE — Progress Notes (Signed)
Spoke with Md about IV sites. Anticipated discharge tomorrow. IV sites dated for the 08/02/13 can remain inplace.

## 2013-08-06 NOTE — Progress Notes (Signed)
PATIENT DETAILS Name: Dale Hopkins Age: 68 y.o. Sex: male Date of Birth: 12/14/1945 Admit Date: 08/02/2013 Admitting Physician Raylene Miyamoto, MD HCW:[id number],BTDVV W, MD  Brief narrative:  Dale Hopkins is a 68 y.o. male presenting on 08/02/2013 with has a past medical history of Allergy; Arthritis; Cataract; and Hypertension. who presents with flu-like symptoms x 4 days. Started with sinus congestion, fevers, muscle aches, and eventually dyspnea. Also c/o hemoptysis. Got much worse evening of 2/11 and his wife called EMS the morning of 2/12. Required BiPAP in the ED and subsequently admitted by critical care to the ICU.  LINES / TUBES:  Left ij 2/12>> 2/16  CULTURES:  Strep Ag 2/13 >> NEG  Leg Ag 2/13 >> NEG  Blood 2/12 >>  Resp Viral 2/12 >>   Antibiotics: Vanc 2/12 >>> 2/13  Oseltamivir 2/12 >>  Rocephin 2/12 >>> 2/14 Levaquin 2/12 >>>    Subjective: Improving-no major complaints.  Assessment/Plan: Principal Problem:   Acute respiratory failure with hypoxia - Secondary to community acquired pneumonia and influenza - Significantly better, initially admitted to the intensive care unit and required BiPAP support. However with antibiotics, and Tamiflu significantly better. - Will ask RN to taper off oxygen and ambulate.  Active Problems: Severe sepsis  - Secondary to pneumonia - Resolved.  Community acquired pneumonia - Continue with Levaquin- currently day 5 - Significant clinical improvement, mild leukocytosis persist. However afebrile. - Blood cultures 2/12 negative so far  Influenza A. - Continue with Tamiflu-last day today - Clinically much better as noted above  History of chronic leukopenia - Now with leukocytosis acute illness/pneumonia - Monitor CBC  History of chronic thrombocytopenia - Monitor CBC, platelet counts low but stable  Hypertension - Controlled with labetalol, amlodipine and Avapro  Deconditioning - Obtain physical  therapy. This is from acute illness  Disposition: Remain inpatient- begin discharge planning  DVT Prophylaxis:  SCD's given history of thrombocytopenia-ambulate  Code Status: Full code  Family Communication Spouse at bedside  Procedures:  None  CONSULTS:  pulmonary/intensive care   MEDICATIONS: Scheduled Meds: . amLODipine  10 mg Oral Daily  . aspirin  81 mg Oral Daily  . feeding supplement (ENSURE)  1 Container Oral TID BM  . irbesartan  150 mg Oral Daily  . labetalol  200 mg Oral BID  . levofloxacin  750 mg Oral Q24H  . oseltamivir  75 mg Oral BID   Continuous Infusions:  PRN Meds:.sodium chloride, acetaminophen, albuterol, labetalol, polyvinyl alcohol  Antibiotics: Anti-infectives   Start     Dose/Rate Route Frequency Ordered Stop   08/05/13 1000  oseltamivir (TAMIFLU) capsule 75 mg     75 mg Oral 2 times daily 08/05/13 0912 08/10/13 0959   08/04/13 1300  levofloxacin (LEVAQUIN) tablet 750 mg     750 mg Oral Every 24 hours 08/04/13 1217     08/04/13 1000  oseltamivir (TAMIFLU) capsule 75 mg  Status:  Discontinued     75 mg Oral Daily 08/03/13 1126 08/03/13 1142   08/04/13 1000  oseltamivir (TAMIFLU) capsule 75 mg  Status:  Discontinued     75 mg Oral 2 times daily 08/03/13 1142 08/04/13 1217   08/02/13 2200  vancomycin (VANCOCIN) IVPB 1000 mg/200 mL premix  Status:  Discontinued     1,000 mg 200 mL/hr over 60 Minutes Intravenous Every 12 hours 08/02/13 1032 08/03/13 1126   08/02/13 1300  cefTRIAXone (ROCEPHIN) 1 g in dextrose 5 % 50  mL IVPB  Status:  Discontinued     1 g 100 mL/hr over 30 Minutes Intravenous Every 24 hours 08/02/13 1239 08/04/13 1217   08/02/13 1300  levofloxacin (LEVAQUIN) IVPB 750 mg  Status:  Discontinued     750 mg 100 mL/hr over 90 Minutes Intravenous Every 24 hours 08/02/13 1239 08/04/13 1356   08/02/13 1200  Ampicillin-Sulbactam (UNASYN) 3 g in sodium chloride 0.9 % 100 mL IVPB  Status:  Discontinued     3 g 100 mL/hr over 60 Minutes  Intravenous Every 6 hours 08/02/13 1032 08/02/13 1234   08/02/13 1115  oseltamivir (TAMIFLU) capsule 150 mg  Status:  Discontinued     150 mg Oral Daily 08/02/13 1102 08/03/13 1126   08/02/13 0845  vancomycin (VANCOCIN) IVPB 1000 mg/200 mL premix     1,000 mg 200 mL/hr over 60 Minutes Intravenous  Once 08/02/13 0832 08/02/13 1136   08/02/13 0845  piperacillin-tazobactam (ZOSYN) IVPB 3.375 g     3.375 g 100 mL/hr over 30 Minutes Intravenous  Once 08/02/13 0832 08/02/13 0949       PHYSICAL EXAM: Vital signs in last 24 hours: Filed Vitals:   08/05/13 1200 08/05/13 1413 08/05/13 2153 08/06/13 0500  BP: 139/85 115/71 113/66 133/73  Pulse: 112 104 96 102  Temp: 99.4 F (37.4 C) 98.4 F (36.9 C) 98.1 F (36.7 C) 98.6 F (37 C)  TempSrc: Oral Oral Oral Oral  Resp: 34 24 22 22   Height:      Weight:      SpO2: 92% 90% 97% 91%    Weight change:  Filed Weights   08/02/13 0807 08/02/13 1153 08/03/13 0453  Weight: 79.379 kg (175 lb) 82.3 kg (181 lb 7 oz) 85.6 kg (188 lb 11.4 oz)   Body mass index is 28.7 kg/(m^2).   Gen Exam: Awake and alert with clear speech.   Neck: Supple, No JVD.   Chest: A few bibasilar rales CVS: S1 S2 Regular, no murmurs.  Abdomen: soft, BS +, non tender, non distended.  Extremities: no edema, lower extremities warm to touch. Neurologic: Non Focal.   Skin: No Rash.   Wounds: N/A.    Intake/Output from previous day:  Intake/Output Summary (Last 24 hours) at 08/06/13 1102 Last data filed at 08/06/13 0920  Gross per 24 hour  Intake    924 ml  Output    300 ml  Net    624 ml     LAB RESULTS: CBC  Recent Labs Lab 08/02/13 0822 08/02/13 1109 08/03/13 0440 08/04/13 0500 08/06/13 0655  WBC 3.9* 2.0* 3.5* 13.4* 17.6*  HGB 15.4 13.3 13.2 13.2 12.8*  HCT 43.1 38.1* 37.1* 36.9* 36.7*  PLT 129* 89* 89* 98* 122*  MCV 82.9 82.6 82.4 82.0 82.3  MCH 29.6 28.9 29.3 29.3 28.7  MCHC 35.7 34.9 35.6 35.8 34.9  RDW 12.8 12.8 13.1 13.4 14.0  LYMPHSABS  0.6*  --   --   --   --   MONOABS 0.4  --   --   --   --   EOSABS 0  --   --   --   --   BASOSABS 0  --   --   --   --     Chemistries   Recent Labs Lab 08/02/13 1109 08/02/13 1400 08/03/13 0440 08/04/13 0500 08/06/13 0655  NA 140 143  144 142 143 142  K 3.1* 3.3*  3.4* 4.0 3.6* 3.4*  CL 103 108  109 104  105 104  CO2 21 22  22 22 23 25   GLUCOSE 126* 86  86 69* 55* 93  BUN 19 19  19  24* 23 27*  CREATININE 1.20 1.17  1.14 1.16 0.94 1.01  CALCIUM 7.8* 7.0*  7.1* 7.4* 8.2* 9.0    CBG: No results found for this basename: GLUCAP,  in the last 168 hours  GFR Estimated Creatinine Clearance: 75.6 ml/min (by C-G formula based on Cr of 1.01).  Coagulation profile  Recent Labs Lab 08/02/13 1400  INR 1.51*    Cardiac Enzymes No results found for this basename: CK, CKMB, TROPONINI, MYOGLOBIN,  in the last 168 hours  No components found with this basename: POCBNP,  No results found for this basename: DDIMER,  in the last 72 hours No results found for this basename: HGBA1C,  in the last 72 hours No results found for this basename: CHOL, HDL, LDLCALC, TRIG, CHOLHDL, LDLDIRECT,  in the last 72 hours No results found for this basename: TSH, T4TOTAL, FREET3, T3FREE, THYROIDAB,  in the last 72 hours No results found for this basename: VITAMINB12, FOLATE, FERRITIN, TIBC, IRON, RETICCTPCT,  in the last 72 hours No results found for this basename: LIPASE, AMYLASE,  in the last 72 hours  Urine Studies No results found for this basename: UACOL, UAPR, USPG, UPH, UTP, UGL, UKET, UBIL, UHGB, UNIT, UROB, ULEU, UEPI, UWBC, URBC, UBAC, CAST, CRYS, UCOM, BILUA,  in the last 72 hours  MICROBIOLOGY: Recent Results (from the past 240 hour(s))  CULTURE, BLOOD (ROUTINE X 2)     Status: None   Collection Time    08/02/13  8:50 AM      Result Value Ref Range Status   Specimen Description BLOOD LEFT ANTECUBITAL   Final   Special Requests     Final   Value: BOTTLES DRAWN AEROBIC AND  ANAEROBIC 5MLS RED AND 10MLS BLUE   Culture  Setup Time     Final   Value: 08/02/2013 12:53     Performed at [facility]   Culture     Final   Value:        BLOOD CULTURE RECEIVED NO GROWTH TO DATE CULTURE WILL BE HELD FOR 5 DAYS BEFORE ISSUING A FINAL NEGATIVE REPORT     Performed at [facility]   Report Status PENDING   Incomplete  CULTURE, BLOOD (ROUTINE X 2)     Status: None   Collection Time    08/02/13  8:57 AM      Result Value Ref Range Status   Specimen Description BLOOD RIGHT HAND   Final   Special Requests BOTTLES DRAWN AEROBIC AND ANAEROBIC 5MLS   Final   Culture  Setup Time     Final   Value: 08/02/2013 12:53     Performed at [facility]   Culture     Final   Value:        BLOOD CULTURE RECEIVED NO GROWTH TO DATE CULTURE WILL BE HELD FOR 5 DAYS BEFORE ISSUING A FINAL NEGATIVE REPORT     Performed at [facility]   Report Status PENDING   Incomplete  RESPIRATORY VIRUS PANEL     Status: Abnormal   Collection Time    08/02/13 11:17 AM      Result Value Ref Range Status   Source - RVPAN NASAL SWAB   Corrected   Comment: CORRECTED ON 02/15 AT 6767: PREVIOUSLY REPORTED AS NASAL SWAB   Respiratory Syncytial Virus A NOT  DETECTED   Final   Respiratory Syncytial Virus B NOT DETECTED   Final   Influenza A DETECTED (*)  Final   Influenza B NOT DETECTED   Final   Parainfluenza 1 NOT DETECTED   Final   Parainfluenza 2 NOT DETECTED   Final   Parainfluenza 3 NOT DETECTED   Final   Metapneumovirus NOT DETECTED   Final   Rhinovirus NOT DETECTED   Final   Adenovirus NOT DETECTED   Final   Influenza A H1 NOT DETECTED   Final   Influenza A H3 DETECTED (*)  Final   Comment: (NOTE)           Normal Reference Range for each Analyte: NOT DETECTED     Testing performed using the Luminex xTAG Respiratory Viral Panel test     kit.     This test was developed and its performance characteristics determined     by [facility]. It has not been  cleared or approved by the [place]     Food and Drug Administration. This test is used for clinical purposes.     It should not be regarded as investigational or for research. This     laboratory is certified under the [place] (CLIA) as qualified to perform high complexity     clinical laboratory testing.     Performed at [facility] PCR SCREENING     Status: None   Collection Time    08/02/13  2:50 PM      Result Value Ref Range Status   MRSA by PCR NEGATIVE  NEGATIVE Final   Comment:            The GeneXpert MRSA Assay (FDA     approved for NASAL specimens     only), is one component of a     comprehensive MRSA colonization     surveillance program. It is not     intended to diagnose MRSA     infection nor to guide or     monitor treatment for     MRSA infections.    RADIOLOGY STUDIES/RESULTS: Dg Chest Port 1 View  08/04/2013   CLINICAL DATA:  Respiratory failure  EXAM: PORTABLE CHEST - 1 VIEW  COMPARISON:  DG CHEST 1V PORT dated 08/03/2013; DG CHEST 1V PORT dated 08/02/2013; DG CHEST 1V PORT dated 08/02/2013  FINDINGS: Left IJ central venous catheter tip projects over the superior vena cava. Stable enlarged cardiac and mediastinal contours. No significant interval change consolidative right medial lower lung opacities. Persistent consolidative retrocardiac opacities. No definite pleural effusion or pneumothorax.  IMPRESSION: Unchanged right-greater-than-left basilar consolidative opacities. These may represent pneumonia in the appropriate clinical setting. Continued radiographic followup to ensure resolution is recommended.   Electronically Signed   By: Lovey Newcomer M.D.   On: 08/04/2013 11:26   Dg Chest Port 1 View  08/03/2013   CLINICAL DATA:  Infiltrates  EXAM: PORTABLE CHEST - 1 VIEW  COMPARISON:  08/02/2013  FINDINGS: Cardiac shadow is stable. Persistent right perihilar mass lesion is noted. There is been interval increase in  the degree of right-sided pleural effusion and right basilar infiltrate likely related to the underlying lesion. A left central venous line is again noted and stable. No other focal abnormality is seen.  IMPRESSION: Increasing right basilar infiltrate and effusion when compared with the previous day.   Electronically Signed   By: Linus Mako.D.  On: 08/03/2013 07:31   Dg Chest Port 1 View  08/02/2013   CLINICAL DATA:  Central line placement.  Shortness of breath.  EXAM: PORTABLE CHEST - 1 VIEW  COMPARISON:  DG CHEST 1V PORT dated 08/02/2013  FINDINGS: Left IJ line the crosses the midline to the lateral wall of the proximal left cyst SVC. No pneumothorax.  Cardiomegaly with increased right hilar an and infrahilar opacity. The Low lung volumes are present, causing crowding of the pulmonary vasculature. Interstitial accentuation in the lungs is mildly improved.  IMPRESSION: 1. Left IJ line tip: Upper SVC. The catheter tip is oriented somewhat transversely against the lateral wall of the SVC, a positioning which can predispose to increased risk of occlusion of the catheter-consider advancing to 3 cm. 2. Cardiomegaly with reduced interstitial accentuation. 3. Right hilar and infrahilar mass or pneumonia, slightly increased in prominence. Follow up to clearance is recommended.   Electronically Signed   By: Sherryl Barters M.D.   On: 08/02/2013 13:17   Dg Chest Portable 1 View  08/02/2013   CLINICAL DATA:  Fever.  Cough.  EXAM: PORTABLE CHEST - 1 VIEW  COMPARISON:  None.  FINDINGS: Abnormal right infrahilar and right middle lobe density obscures the right cardiac border. Mild cardiomegaly. Mildly indistinct pulmonary vasculature. No pleural effusion identified.  IMPRESSION: 1. Abnormal right infrahilar opacity probably reflects pneumonia given the patient's history of fever and cough. Followup chest radiography is recommended in 4 weeks time to ensure resolution and exclude underlying malignancy. 2. Mild  cardiomegaly with suspected low-level pulmonary venous hypertension.   Electronically Signed   By: Sherryl Barters M.D.   On: 08/02/2013 09:00    Oren Binet, MD  [facility] Pager:[phone]  If 7PM-7AM, please contact night-coverage [email or web address] Password [email or web address] 08/06/2013, 11:02 AM   LOS: 4 days

## 2013-08-06 NOTE — Evaluation (Signed)
Physical Therapy Evaluation Patient Details Name: Dale Hopkins MRN: [id number] DOB: 11/22/1945 Today's Date: 08/06/2013 Time: 5809-9833 PT Time Calculation (min): 20 min  PT Assessment / Plan / Recommendation History of Present Illness  Pt adm with resp failure requiring bipap due to flu and PNA.  Clinical Impression  Pt admitted with above. Pt currently with functional limitations due to the deficits listed below (see PT Problem List).  Pt will benefit from skilled PT to increase their independence and safety with mobility to allow discharge home with wife.      PT Assessment  Patient needs continued PT services    Follow Up Recommendations  No PT follow up    Does the patient have the potential to tolerate intense rehabilitation      Barriers to Discharge        Equipment Recommendations  None recommended by PT    Recommendations for Other Services     Frequency Min 3X/week    Precautions / Restrictions Precautions Precautions: Fall   Pertinent Vitals/Pain See flow sheet      Mobility  Bed Mobility Overal bed mobility: Needs Assistance Bed Mobility: Sit to Supine Sit to supine: Min assist General bed mobility comments: assist for legs Transfers Overall transfer level: Needs assistance Equipment used: None Transfers: Sit to/from Stand Sit to Stand: Supervision Ambulation/Gait Ambulation/Gait assistance: Min guard Ambulation Distance (Feet): 150 Feet Assistive device: None Gait Pattern/deviations: Step-through pattern;Decreased stride length Gait velocity interpretation: Below normal speed for age/gender General Gait Details: Pt fatigues quickly and is slightly unsteady.    Exercises     PT Diagnosis: Difficulty walking;Generalized weakness  PT Problem List: Decreased strength;Decreased activity tolerance;Decreased balance;Decreased mobility PT Treatment Interventions: DME instruction;Gait training;Functional mobility training;Therapeutic  activities;Therapeutic exercise;Balance training;Patient/family education     PT Goals(Current goals can be found in the care plan section) Acute Rehab PT Goals Patient Stated Goal: Return home PT Goal Formulation: With patient Time For Goal Achievement: 08/13/13 Potential to Achieve Goals: Good  Visit Information  Last PT Received On: 08/06/13 Assistance Needed: +1 History of Present Illness: Pt adm with resp failure requiring bipap due to flu and PNA.       Prior [place] expects to be discharged to:: Private residence Living Arrangements: Spouse/significant other Available Help at Discharge: Family Type of Home: House Home Access: Stairs to enter Technical brewer of Steps: 1 Home Layout: One level Home Equipment: None Prior Function Level of Independence: Independent Comments: Works at [employer]. Communication Communication: No difficulties    Cognition  Cognition Arousal/Alertness: Awake/alert Behavior During Therapy: WFL for tasks assessed/performed Overall Cognitive Status: Within Functional Limits for tasks assessed    Extremity/Trunk Assessment Upper Extremity Assessment Upper Extremity Assessment: Overall WFL for tasks assessed Lower Extremity Assessment Lower Extremity Assessment: Generalized weakness   Balance Balance Standing balance support: No upper extremity supported Standing balance-Leahy Scale: Fair  End of Session PT - End of Session Equipment Utilized During Treatment: Oxygen Activity Tolerance: Patient limited by fatigue Patient left: in bed;with call bell/phone within reach;with family/visitor present Nurse Communication: Mobility status  GP     [facility] 08/06/2013, 2:32 PM  [facility] PT [phone]

## 2013-08-07 DIAGNOSIS — M7989 Other specified soft tissue disorders: Secondary | ICD-10-CM

## 2013-08-07 DIAGNOSIS — I10 Essential (primary) hypertension: Secondary | ICD-10-CM

## 2013-08-07 LAB — CBC
HCT: 32.9 % — ABNORMAL LOW (ref 39.0–52.0)
Hemoglobin: 11.3 g/dL — ABNORMAL LOW (ref 13.0–17.0)
MCH: 28.4 pg (ref 26.0–34.0)
MCHC: 34.3 g/dL (ref 30.0–36.0)
MCV: 82.7 fL (ref 78.0–100.0)
Platelets: 140 10*3/uL — ABNORMAL LOW (ref 150–400)
RBC: 3.98 MIL/uL — AB (ref 4.22–5.81)
RDW: 14.2 % (ref 11.5–15.5)
WBC: 17.3 10*3/uL — AB (ref 4.0–10.5)

## 2013-08-07 MED ORDER — LABETALOL HCL 200 MG PO TABS: 200.0000 mg | ORAL_TABLET | Freq: Two times a day (BID) | ORAL | Status: AC

## 2013-08-07 MED ORDER — AMLODIPINE BESYLATE 10 MG PO TABS: 10.0000 mg | ORAL_TABLET | Freq: Every day | ORAL | Status: AC

## 2013-08-07 MED ORDER — ENSURE COMPLETE PO LIQD: 237.0000 mL | Freq: Two times a day (BID) | ORAL | Status: DC

## 2013-08-07 MED ORDER — LEVOFLOXACIN 750 MG PO TABS: 750.0000 mg | ORAL_TABLET | ORAL | Status: DC

## 2013-08-07 MED ORDER — ALBUTEROL SULFATE (2.5 MG/3ML) 0.083% IN NEBU: 2.5000 mg | INHALATION_SOLUTION | RESPIRATORY_TRACT | Status: DC | PRN

## 2013-08-07 NOTE — Progress Notes (Signed)
VASCULAR LAB PRELIMINARY  PRELIMINARY  PRELIMINARY  PRELIMINARY  Bilateral lower extremity venous duplex completed.    Preliminary report:  Bilateral:  No evidence of DVT, superficial thrombosis, or Baker's Cyst.   Jakaree Pickard, RVS 08/07/2013, 12:09 PM

## 2013-08-07 NOTE — Discharge Summary (Signed)
PATIENT DETAILS Name: Dale Hopkins Age: 68 y.o. Sex: male Date of Birth: Nov 18, 1945 MRN: [id number]. Admit Date: 08/02/2013 Admitting Physician: Raylene Miyamoto, MD ZJQ:[id number],XTKWI W, MD  Recommendations for Outpatient Follow-up:  1. Please repeat chest x-ray in 6-8 weeks to document resolution of the pneumonia 2. Discharge on home O2, please reassess if oxygen is required next visit 3. Please repeat CBC and chemistries at next visit  PRIMARY DISCHARGE DIAGNOSIS:  Principal Problem:   Acute respiratory failure with hypoxia Active Problems:   Leukopenia   Thrombocytopenia   Severe sepsis with acute organ dysfunction   HTN (hypertension)   CAP (community acquired pneumonia)   Influenza due to identified novel influenza A virus with other respiratory manifestations      PAST MEDICAL HISTORY: Past Medical History  Diagnosis Date  . Allergy   . Arthritis   . Cataract   . Hypertension     DISCHARGE MEDICATIONS:   Medication List         albuterol (2.5 MG/3ML) 0.083% nebulizer solution  Commonly known as:  PROVENTIL  Take 3 mLs (2.5 mg total) by nebulization every 4 (four) hours as needed for wheezing or shortness of breath.     amLODipine 10 MG tablet  Commonly known as:  NORVASC  Take 1 tablet (10 mg total) by mouth daily.     aspirin 81 MG tablet  Take 81 mg by mouth daily.     feeding supplement (ENSURE COMPLETE) Liqd  Take 237 mLs by mouth 2 (two) times daily between meals.     fish oil-omega-3 fatty acids 1000 MG capsule  Take 1 g by mouth daily.     fluticasone 50 MCG/ACT nasal spray  Commonly known as:  FLONASE  Place 2 sprays into both nostrils daily.     labetalol 200 MG tablet  Commonly known as:  NORMODYNE  Take 1 tablet (200 mg total) by mouth 2 (two) times daily.     levofloxacin 750 MG tablet  Commonly known as:  LEVAQUIN  Take 1 tablet (750 mg total) by mouth daily.     multivitamin tablet  Take 1 tablet by mouth daily.     sildenafil 100 MG tablet  Commonly known as:  VIAGRA  Take 50 mg by mouth daily as needed.     telmisartan 40 MG tablet  Commonly known as:  MICARDIS  Take 40 mg by mouth daily.     TYLENOL 325 MG tablet  Generic drug:  acetaminophen  Take 650 mg by mouth every 6 (six) hours as needed (pain).        ALLERGIES:  No Known Allergies  BRIEF HPI:  See H&P, Labs, Consult and Test reports for all details in brief,Dale Hopkins is a 68 y.o. male presenting on 08/02/2013 with has a past medical history of Allergy; Arthritis; Cataract; and Hypertension. who presented with flu-like symptoms x 4 days. Started with sinus congestion, fevers, muscle aches, and eventually dyspnea. Also c/o hemoptysis. Got much worse evening of 2/11 and his wife called EMS the morning of 2/12. Required BiPAP in the ED and subsequently admitted by critical care to the ICU.  CONSULTATIONS:   pulmonary/intensive care  PERTINENT RADIOLOGIC STUDIES: Dg Chest Port 1 View  08/04/2013   CLINICAL DATA:  Respiratory failure  EXAM: PORTABLE CHEST - 1 VIEW  COMPARISON:  DG CHEST 1V PORT dated 08/03/2013; DG CHEST 1V PORT dated 08/02/2013; DG CHEST 1V PORT dated 08/02/2013  FINDINGS: Left IJ central venous catheter  tip projects over the superior vena cava. Stable enlarged cardiac and mediastinal contours. No significant interval change consolidative right medial lower lung opacities. Persistent consolidative retrocardiac opacities. No definite pleural effusion or pneumothorax.  IMPRESSION: Unchanged right-greater-than-left basilar consolidative opacities. These may represent pneumonia in the appropriate clinical setting. Continued radiographic followup to ensure resolution is recommended.   Electronically Signed   By: Lovey Newcomer M.D.   On: 08/04/2013 11:26   Dg Chest Port 1 View  08/03/2013   CLINICAL DATA:  Infiltrates  EXAM: PORTABLE CHEST - 1 VIEW  COMPARISON:  08/02/2013  FINDINGS: Cardiac shadow is stable. Persistent right  perihilar mass lesion is noted. There is been interval increase in the degree of right-sided pleural effusion and right basilar infiltrate likely related to the underlying lesion. A left central venous line is again noted and stable. No other focal abnormality is seen.  IMPRESSION: Increasing right basilar infiltrate and effusion when compared with the previous day.   Electronically Signed   By: Inez Catalina M.D.   On: 08/03/2013 07:31   Dg Chest Port 1 View  08/02/2013   CLINICAL DATA:  Central line placement.  Shortness of breath.  EXAM: PORTABLE CHEST - 1 VIEW  COMPARISON:  DG CHEST 1V PORT dated 08/02/2013  FINDINGS: Left IJ line the crosses the midline to the lateral wall of the proximal left cyst SVC. No pneumothorax.  Cardiomegaly with increased right hilar an and infrahilar opacity. The Low lung volumes are present, causing crowding of the pulmonary vasculature. Interstitial accentuation in the lungs is mildly improved.  IMPRESSION: 1. Left IJ line tip: Upper SVC. The catheter tip is oriented somewhat transversely against the lateral wall of the SVC, a positioning which can predispose to increased risk of occlusion of the catheter-consider advancing to 3 cm. 2. Cardiomegaly with reduced interstitial accentuation. 3. Right hilar and infrahilar mass or pneumonia, slightly increased in prominence. Follow up to clearance is recommended.   Electronically Signed   By: Sherryl Barters M.D.   On: 08/02/2013 13:17   Dg Chest Portable 1 View  08/02/2013   CLINICAL DATA:  Fever.  Cough.  EXAM: PORTABLE CHEST - 1 VIEW  COMPARISON:  None.  FINDINGS: Abnormal right infrahilar and right middle lobe density obscures the right cardiac border. Mild cardiomegaly. Mildly indistinct pulmonary vasculature. No pleural effusion identified.  IMPRESSION: 1. Abnormal right infrahilar opacity probably reflects pneumonia given the patient's history of fever and cough. Followup chest radiography is recommended in 4 weeks time to  ensure resolution and exclude underlying malignancy. 2. Mild cardiomegaly with suspected low-level pulmonary venous hypertension.   Electronically Signed   By: Sherryl Barters M.D.   On: 08/02/2013 09:00     PERTINENT LAB RESULTS: CBC:  Recent Labs  08/06/13 0655 08/07/13 0524  WBC 17.6* 17.3*  HGB 12.8* 11.3*  HCT 36.7* 32.9*  PLT 122* 140*   CMET CMP     Component Value Date/Time   NA 142 08/06/2013 0655   K 3.4* 08/06/2013 0655   CL 104 08/06/2013 0655   CO2 25 08/06/2013 0655   GLUCOSE 93 08/06/2013 0655   BUN 27* 08/06/2013 0655   CREATININE 1.01 08/06/2013 0655   CALCIUM 9.0 08/06/2013 0655   PROT 6.2 08/02/2013 1400   ALBUMIN 2.9* 08/02/2013 1400   AST 21 08/02/2013 1400   ALT 8 08/02/2013 1400   ALKPHOS 32* 08/02/2013 1400   BILITOT 0.8 08/02/2013 1400   GFRNONAA 75* 08/06/2013 0655   GFRAA 87* 08/06/2013  0655    GFR Estimated Creatinine Clearance: 75.6 ml/min (by C-G formula based on Cr of 1.01). No results found for this basename: LIPASE, AMYLASE,  in the last 72 hours No results found for this basename: CKTOTAL, CKMB, CKMBINDEX, TROPONINI,  in the last 72 hours No components found with this basename: POCBNP,  No results found for this basename: DDIMER,  in the last 72 hours No results found for this basename: HGBA1C,  in the last 72 hours No results found for this basename: CHOL, HDL, LDLCALC, TRIG, CHOLHDL, LDLDIRECT,  in the last 72 hours No results found for this basename: TSH, T4TOTAL, FREET3, T3FREE, THYROIDAB,  in the last 72 hours No results found for this basename: VITAMINB12, FOLATE, FERRITIN, TIBC, IRON, RETICCTPCT,  in the last 72 hours Coags: No results found for this basename: PT, INR,  in the last 72 hours Microbiology: Recent Results (from the past 240 hour(s))  CULTURE, BLOOD (ROUTINE X 2)     Status: None   Collection Time    08/02/13  8:50 AM      Result Value Ref Range Status   Specimen Description BLOOD LEFT ANTECUBITAL   Final   Special  Requests     Final   Value: BOTTLES DRAWN AEROBIC AND ANAEROBIC 5MLS RED AND 10MLS BLUE   Culture  Setup Time     Final   Value: 08/02/2013 12:53     Performed at [facility]   Culture     Final   Value:        BLOOD CULTURE RECEIVED NO GROWTH TO DATE CULTURE WILL BE HELD FOR 5 DAYS BEFORE ISSUING A FINAL NEGATIVE REPORT     Performed at [facility]   Report Status PENDING   Incomplete  CULTURE, BLOOD (ROUTINE X 2)     Status: None   Collection Time    08/02/13  8:57 AM      Result Value Ref Range Status   Specimen Description BLOOD RIGHT HAND   Final   Special Requests BOTTLES DRAWN AEROBIC AND ANAEROBIC 5MLS   Final   Culture  Setup Time     Final   Value: 08/02/2013 12:53     Performed at [facility]   Culture     Final   Value:        BLOOD CULTURE RECEIVED NO GROWTH TO DATE CULTURE WILL BE HELD FOR 5 DAYS BEFORE ISSUING A FINAL NEGATIVE REPORT     Performed at [facility]   Report Status PENDING   Incomplete  RESPIRATORY VIRUS PANEL     Status: Abnormal   Collection Time    08/02/13 11:17 AM      Result Value Ref Range Status   Source - RVPAN NASAL SWAB   Corrected   Comment: CORRECTED ON 02/15 AT 9030: PREVIOUSLY REPORTED AS NASAL SWAB   Respiratory Syncytial Virus A NOT DETECTED   Final   Respiratory Syncytial Virus B NOT DETECTED   Final   Influenza A DETECTED (*)  Final   Influenza B NOT DETECTED   Final   Parainfluenza 1 NOT DETECTED   Final   Parainfluenza 2 NOT DETECTED   Final   Parainfluenza 3 NOT DETECTED   Final   Metapneumovirus NOT DETECTED   Final   Rhinovirus NOT DETECTED   Final   Adenovirus NOT DETECTED   Final   Influenza A H1 NOT DETECTED   Final   Influenza A H3 DETECTED (*)  Final   Comment: (NOTE)           Normal Reference Range for each Analyte: NOT DETECTED     Testing performed using the Luminex xTAG Respiratory Viral Panel test     kit.     This test was developed and its performance characteristics  determined     by [facility]. It has not been cleared or approved by the [place]     Food and Drug Administration. This test is used for clinical purposes.     It should not be regarded as investigational or for research. This     laboratory is certified under the [place] (CLIA) as qualified to perform high complexity     clinical laboratory testing.     Performed at [place] PCR SCREENING     Status: None   Collection Time    08/02/13  2:50 PM      Result Value Ref Range Status   MRSA by PCR NEGATIVE  NEGATIVE Final   Comment:            The GeneXpert MRSA Assay (FDA     approved for NASAL specimens     only), is one component of a     comprehensive MRSA colonization     surveillance program. It is not     intended to diagnose MRSA     infection nor to guide or     monitor treatment for     MRSA infections.     BRIEF HOSPITAL COURSE:  Brief narrative:  Dale Hopkins is a 68 y.o. male presenting on 08/02/2013 with has a past medical history of Allergy; Arthritis; Cataract; and Hypertension. who presents with flu-like symptoms x 4 days. Started with sinus congestion, fevers, muscle aches, and eventually dyspnea. Also c/o hemoptysis. Got much worse evening of 2/11 and his wife called EMS the morning of 2/12. Required BiPAP in the ED and subsequently admitted by critical care to the ICU.   LINES / TUBES:  Left ij 2/12>> 2/16   CULTURES:  Strep Ag 2/13 >> NEG  Leg Ag 2/13 >> NEG  Blood 2/12 >> negative so far Resp Viral 2/12 >> positive for influenza A.  Antibiotics:  Vanc 2/12 >>> 2/13  Oseltamivir 2/12 >> 2/17 Rocephin 2/12 >>> 2/14  Levaquin 2/12 >>> continue on discharge for 4 more days to complete a ten-day course  Please see below for hospital course by problem list Acute respiratory failure with hypoxia  - Secondary to community acquired pneumonia and influenza A. - Significantly better, initially  admitted to the intensive care unit and required BiPAP support. However with antibiotics, and Tamiflu significantly better.  - Unfortunately, still hypoxic and requiring oxygen but significantly improved and on admission. Patient continues to improve rapidly. Suspect he would require a short period of oxygen at home on discharge. Please reassess requirement for oxygen upon subsequent visits with PCP. - Please note, lower extremity Dopplers bilaterally were negative for DVT.  Severe sepsis  - Secondary to pneumonia  - Resolved.   Community acquired pneumonia  - Continue with Levaquin- currently day 6, continue for 4 more days to complete a ten-day course. - Significant clinical improvement, mild leukocytosis persists. However afebrile, and has had significant improvement in the past 2 days ago and. - Blood cultures 2/12 negative so far   Influenza A.  - Has completed a 5 day course of  Tamiflu. - Clinically much better as noted above   History of chronic leukopenia  - Now with leukocytosis acute illness/pneumonia  - Monitor CBC at subsequent visits with PCP.  History of chronic thrombocytopenia  - Monitor CBC, platelet counts low but stable   Hypertension  - Controlled with labetalol, amlodipine and Avapro   Deconditioning  -  Secondary to acute/critical illness. Seen by physical therapy, deemed not to need any further physical therapy followup on discharge.  TODAY-DAY OF DISCHARGE:  Subjective:   Dale Hopkins today has no headache,no chest abdominal pain,no new weakness tingling or numbness, feels much better wants to go home today. Patient feels significantly better, both spouse and patient are requesting discharge. He'll require oxygen on discharge for a few more days/weeks.  Objective:   Blood pressure 168/96, pulse 97, temperature 98.7 F (37.1 C), temperature source Oral, resp. rate 20, height _0  (1.727 m), weight 85.6 kg (188 lb 11.4 oz), SpO2 91.00%. No intake or  output data in the 24 hours ending 08/07/13 1248 Filed Weights   08/02/13 0807 08/02/13 1153 08/03/13 0453  Weight: 79.379 kg (175 lb) 82.3 kg (181 lb 7 oz) 85.6 kg (188 lb 11.4 oz)    Exam Awake Alert, Oriented *3, No new F.N deficits, Normal affect [place].AT,PERRAL Supple Neck,No JVD, No cervical lymphadenopathy appriciated.  Symmetrical Chest wall movement, Good air movement bilaterally, CTAB RRR,No Gallops,Rubs or new Murmurs, No Parasternal Heave +ve B.Sounds, Abd Soft, Non tender, No organomegaly appriciated, No rebound -guarding or rigidity. No Cyanosis, Clubbing or edema, No new Rash or bruise  DISCHARGE CONDITION: Stable  DISPOSITION: Home with home health RN for close monitoring, medication management, and skin assessment.  DISCHARGE INSTRUCTIONS:    Activity:  As tolerated with Full fall precautions use walker/cane & assistance as needed  Diet recommendation: Heart Healthy diet  Discharge Orders   Future Orders Complete By Expires   Call MD for:  difficulty breathing, headache or visual disturbances  As directed    Call MD for:  temperature >100.4  As directed    Diet - low sodium heart healthy  As directed    Increase activity slowly  As directed       Follow-up Information   Follow up with Gara Kroner, MD On 08/14/2013. (appt at 2 pm)    Specialty:  Family Medicine   Contact information:   [address], [address] [place] [address] [phone]       Total Time spent on discharge equals 45 minutes.  SignedOren Binet 08/07/2013 12:48 PM

## 2013-08-07 NOTE — Progress Notes (Signed)
Nsg Discharge Note  Admit Date:  08/02/2013 Discharge date: 08/07/2013   Dale Hopkins to be D/C'd Home per MD order.  AVS completed.  Copy for chart, and copy for patient signed, and dated. Patient/caregiver able to verbalize understanding.  Discharge Medication:   Medication List         albuterol (2.5 MG/3ML) 0.083% nebulizer solution  Commonly known as:  PROVENTIL  Take 3 mLs (2.5 mg total) by nebulization every 4 (four) hours as needed for wheezing or shortness of breath.     amLODipine 10 MG tablet  Commonly known as:  NORVASC  Take 1 tablet (10 mg total) by mouth daily.     aspirin 81 MG tablet  Take 81 mg by mouth daily.     feeding supplement (ENSURE COMPLETE) Liqd  Take 237 mLs by mouth 2 (two) times daily between meals.     fish oil-omega-3 fatty acids 1000 MG capsule  Take 1 g by mouth daily.     fluticasone 50 MCG/ACT nasal spray  Commonly known as:  FLONASE  Place 2 sprays into both nostrils daily.     labetalol 200 MG tablet  Commonly known as:  NORMODYNE  Take 1 tablet (200 mg total) by mouth 2 (two) times daily.     levofloxacin 750 MG tablet  Commonly known as:  LEVAQUIN  Take 1 tablet (750 mg total) by mouth daily.     multivitamin tablet  Take 1 tablet by mouth daily.     sildenafil 100 MG tablet  Commonly known as:  VIAGRA  Take 50 mg by mouth daily as needed.     telmisartan 40 MG tablet  Commonly known as:  MICARDIS  Take 40 mg by mouth daily.     TYLENOL 325 MG tablet  Generic drug:  acetaminophen  Take 650 mg by mouth every 6 (six) hours as needed (pain).        Discharge Assessment: Filed Vitals:   08/07/13 0947  BP: 168/96  Pulse:   Temp:   Resp:    Skin clean, dry and intact without evidence of skin break down, no evidence of skin tears noted. IV catheter discontinued intact. Site without signs and symptoms of complications - no redness or edema noted at insertion site, patient denies c/o pain - only slight tenderness at  site.  Dressing with slight pressure applied.  D/c Instructions-Education: Discharge instructions given to patient/family with verbalized understanding. D/c education completed with patient/family including follow up instructions, medication list, d/c activities limitations if indicated, with other d/c instructions as indicated by MD - patient able to verbalize understanding, all questions fully answered. Patient instructed to return to ED, call 911, or call MD for any changes in condition.  Patient escorted via [place], and D/C home via private auto.  Chirsty Armistead Margaretha Sheffield, RN 08/07/2013 2:48 PM

## 2013-08-07 NOTE — Progress Notes (Signed)
08/07/13 Spoke with patient and wife about HHC. They chose [facility]. Contacted Donna with [facility] and set up [facility]. Contacted Justin with [facility] and set up home O2 and nebulizer for delivery to room. Fuller Plan RN, BSN., CCM

## 2013-08-07 NOTE — Progress Notes (Signed)
SATURATION QUALIFICATIONS: (This note is used to comply with regulatory documentation for home oxygen)   Patient Saturations on Room Air at Rest = 94%  Patient Saturations on Room Air while Ambulating = 82%  Patient Saturations on 3 Liters of oxygen while Ambulating = 90%  Please briefly explain why patient needs home oxygen:

## 2013-08-08 LAB — CULTURE, BLOOD (ROUTINE X 2)
Culture: NO GROWTH
Culture: NO GROWTH

## 2013-09-04 IMAGING — US US ABDOMEN LIMITED
1 series · 14 of 15 positions shown · non-contrast
Comparison: No priors.

CLINICAL DATA: Evaluate spleen size.

LIMITED ABDOMINAL ULTRASOUND

[Series 1: us abdomen limited · 0.32mm/px · 14 of 15 slices shown]
[im 1/15]
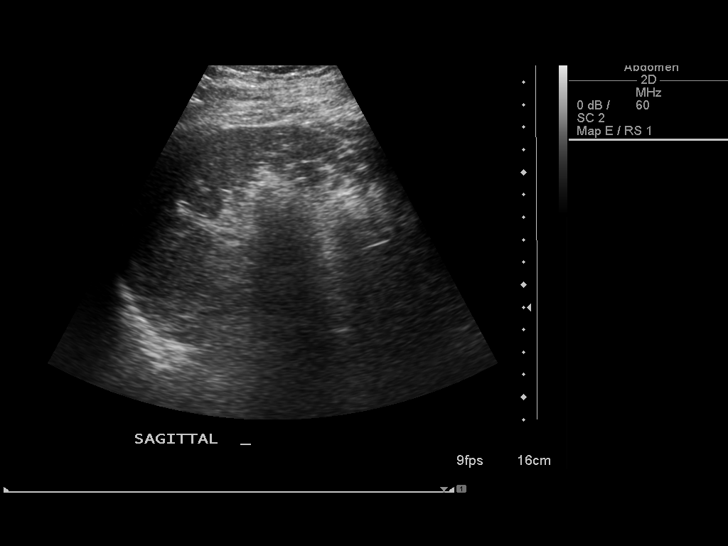
[im 2/15]
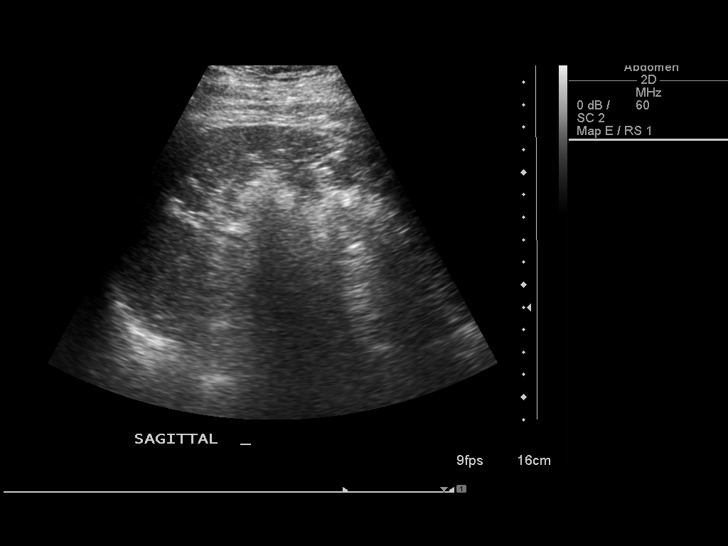
[im 3/15]
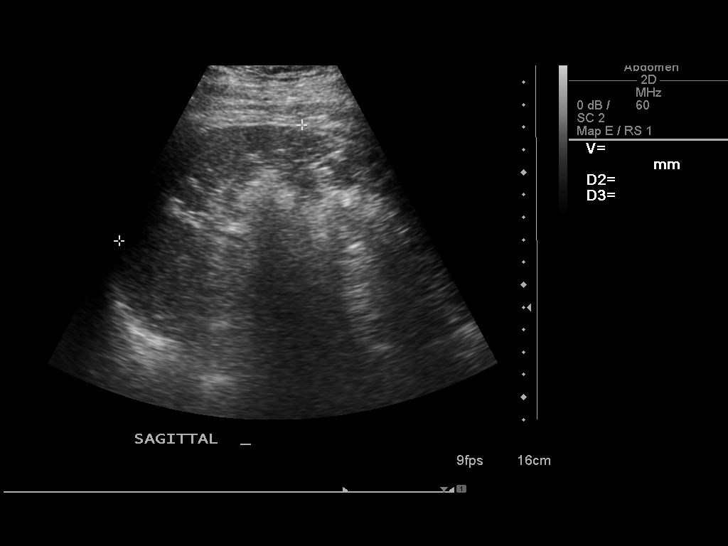
[im 4/15]
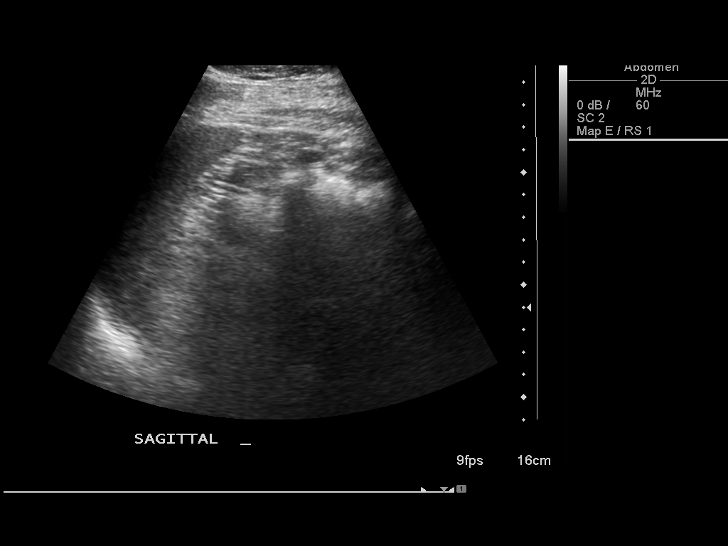
[im 5/15]
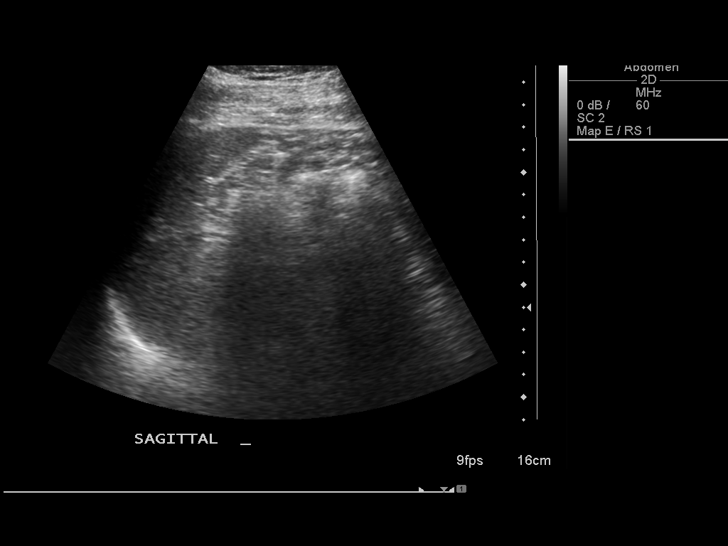
[im 6/15]
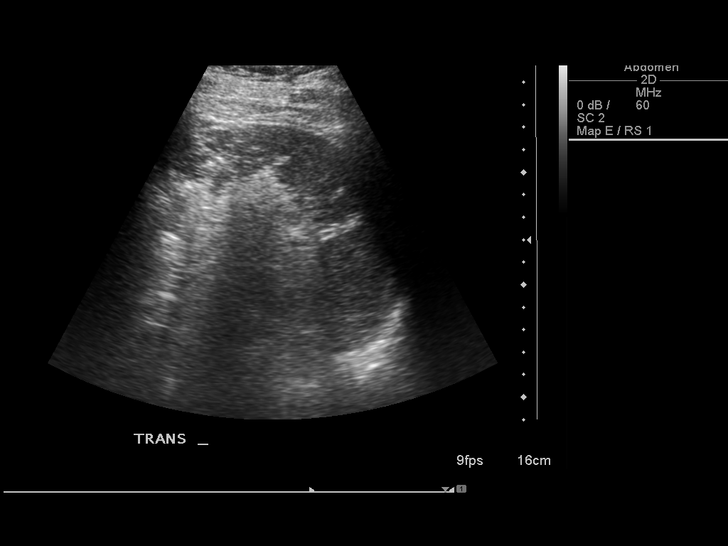
[im 7/15]
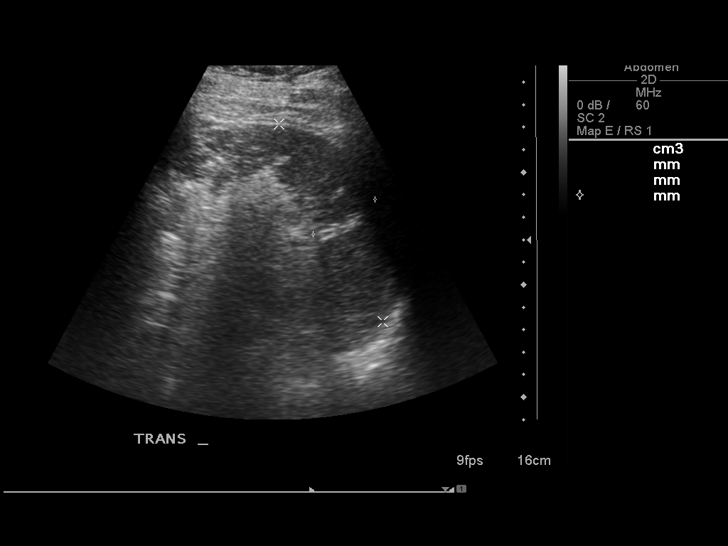
[im 9/15]
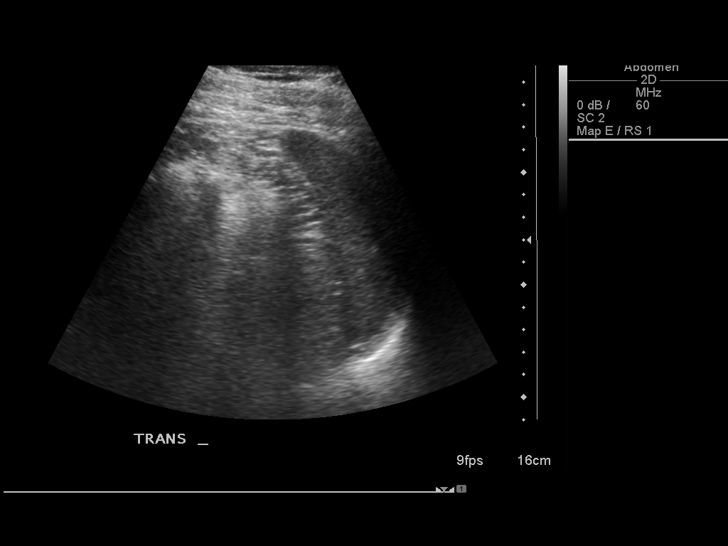
[im 10/15]
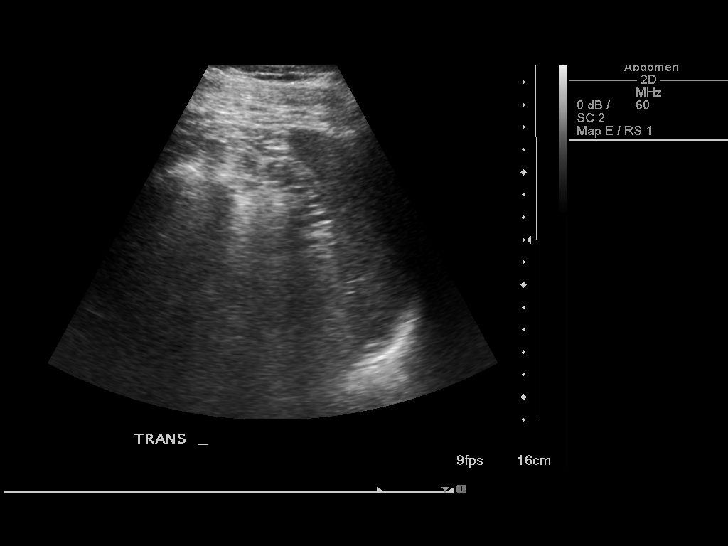
[im 11/15]
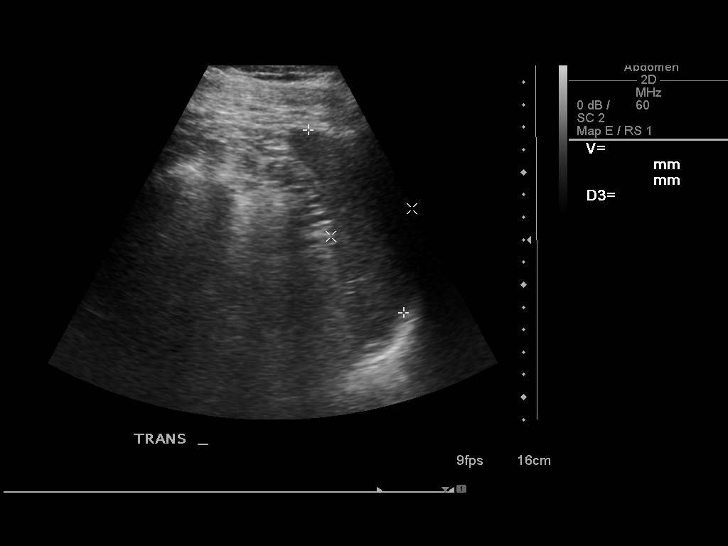
[im 12/15]
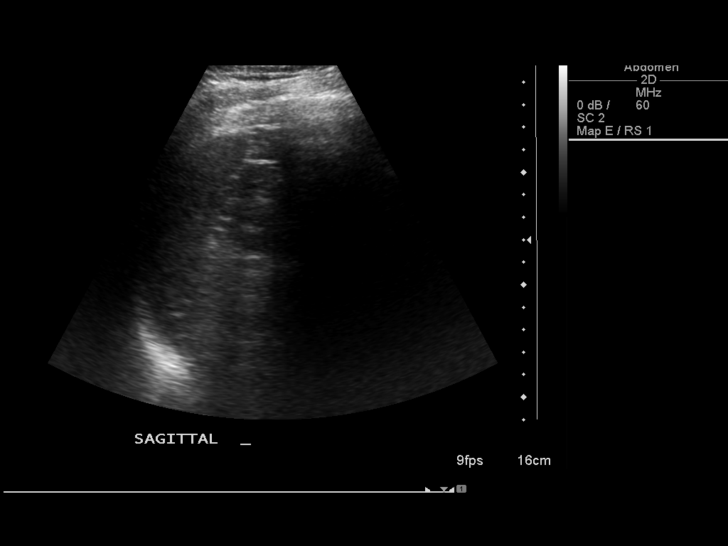
[im 13/15]
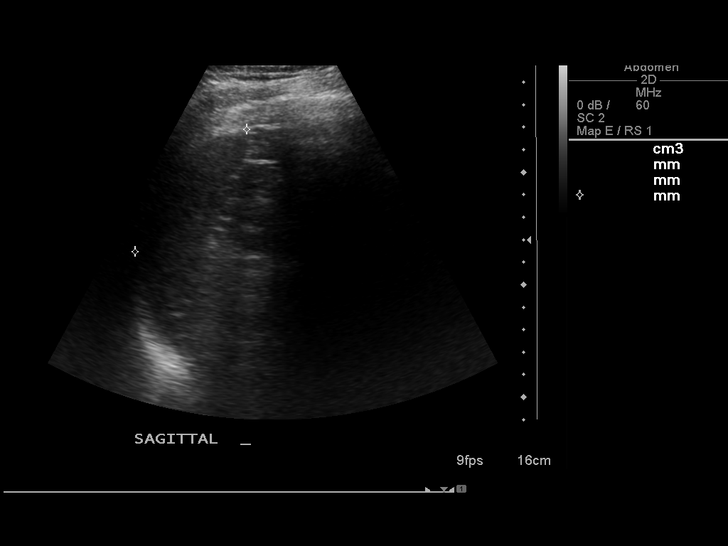
[im 14/15]
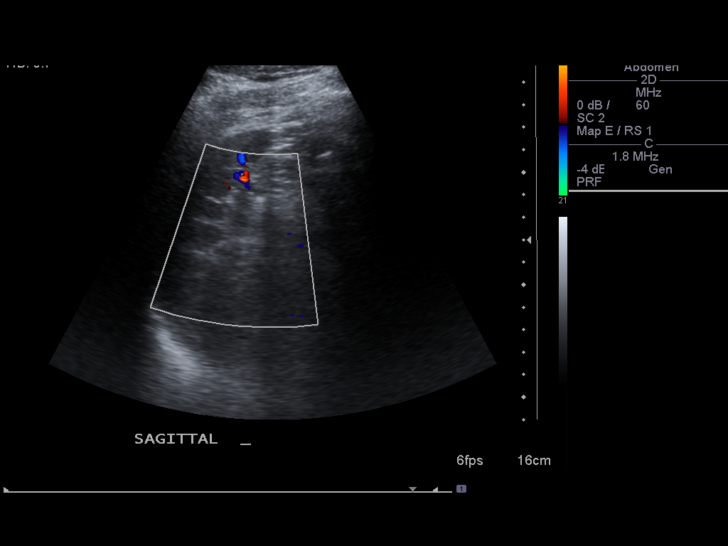
[im 15/15]
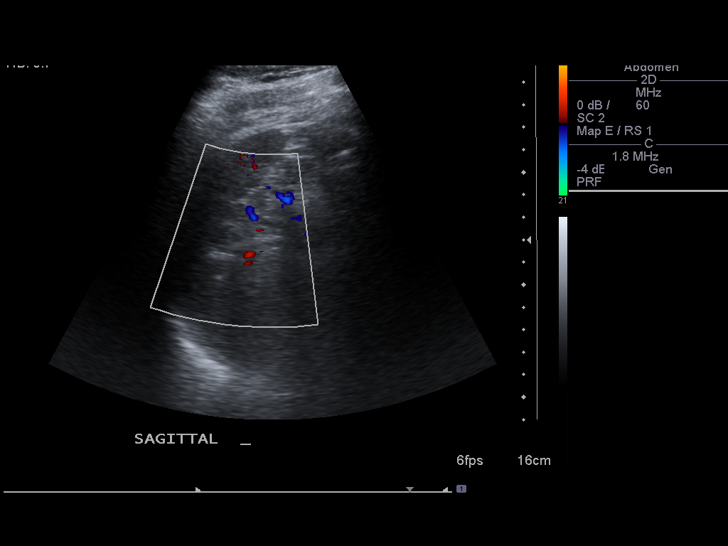

[14 of 15 positions shown; findings below may reference images not displayed]

FINDINGS: Spleen measures approximately 9.2 x 3.8 x 7.4 cm
(estimated volume of 158 cm3).  No focal splenic lesions are noted.
IMPRESSION: 1.  Spleen is normal in size and appearance.

## 2013-09-14 ENCOUNTER — Other Ambulatory Visit: Payer: Self-pay | Admitting: Family Medicine

## 2013-09-14 ENCOUNTER — Ambulatory Visit
Admission: RE | Admit: 2013-09-14 | Discharge: 2013-09-14 | Disposition: A | Discharge status: Home / Self Care | Payer: Commercial Managed Care - HMO | Source: Ambulatory Visit | Attending: Family Medicine | Admitting: Family Medicine

## 2013-09-14 DIAGNOSIS — J189 Pneumonia, unspecified organism: Secondary | ICD-10-CM

## 2013-12-07 ENCOUNTER — Other Ambulatory Visit: Payer: Self-pay | Admitting: Family Medicine

## 2013-12-07 DIAGNOSIS — Z87891 Personal history of nicotine dependence: Secondary | ICD-10-CM

## 2013-12-17 ENCOUNTER — Encounter (INDEPENDENT_AMBULATORY_CARE_PROVIDER_SITE_OTHER): Payer: Self-pay

## 2013-12-17 ENCOUNTER — Ambulatory Visit
Admission: RE | Admit: 2013-12-17 | Discharge: 2013-12-17 | Disposition: A | Discharge status: Home / Self Care | Payer: Commercial Managed Care - HMO | Source: Ambulatory Visit | Attending: Family Medicine | Admitting: Family Medicine

## 2013-12-17 DIAGNOSIS — Z87891 Personal history of nicotine dependence: Secondary | ICD-10-CM

## 2014-09-06 DIAGNOSIS — E785 Hyperlipidemia, unspecified: Secondary | ICD-10-CM | POA: Diagnosis not present

## 2014-09-06 DIAGNOSIS — N529 Male erectile dysfunction, unspecified: Secondary | ICD-10-CM | POA: Diagnosis not present

## 2014-09-06 DIAGNOSIS — I10 Essential (primary) hypertension: Secondary | ICD-10-CM | POA: Diagnosis not present

## 2014-09-06 DIAGNOSIS — J309 Allergic rhinitis, unspecified: Secondary | ICD-10-CM | POA: Diagnosis not present

## 2014-09-06 DIAGNOSIS — Z862 Personal history of diseases of the blood and blood-forming organs and certain disorders involving the immune mechanism: Secondary | ICD-10-CM | POA: Diagnosis not present

## 2015-03-05 DIAGNOSIS — H5210 Myopia, unspecified eye: Secondary | ICD-10-CM | POA: Diagnosis not present

## 2015-03-05 DIAGNOSIS — H524 Presbyopia: Secondary | ICD-10-CM | POA: Diagnosis not present

## 2015-03-13 IMAGING — CR DG CHEST 1V PORT
1 series · 1 of 1 positions shown · non-contrast
Comparison: DG CHEST 1V PORT dated 08/02/2013

CLINICAL DATA: Central line placement.  Shortness of breath.

EXAM:
PORTABLE CHEST - 1 VIEW

[AP]
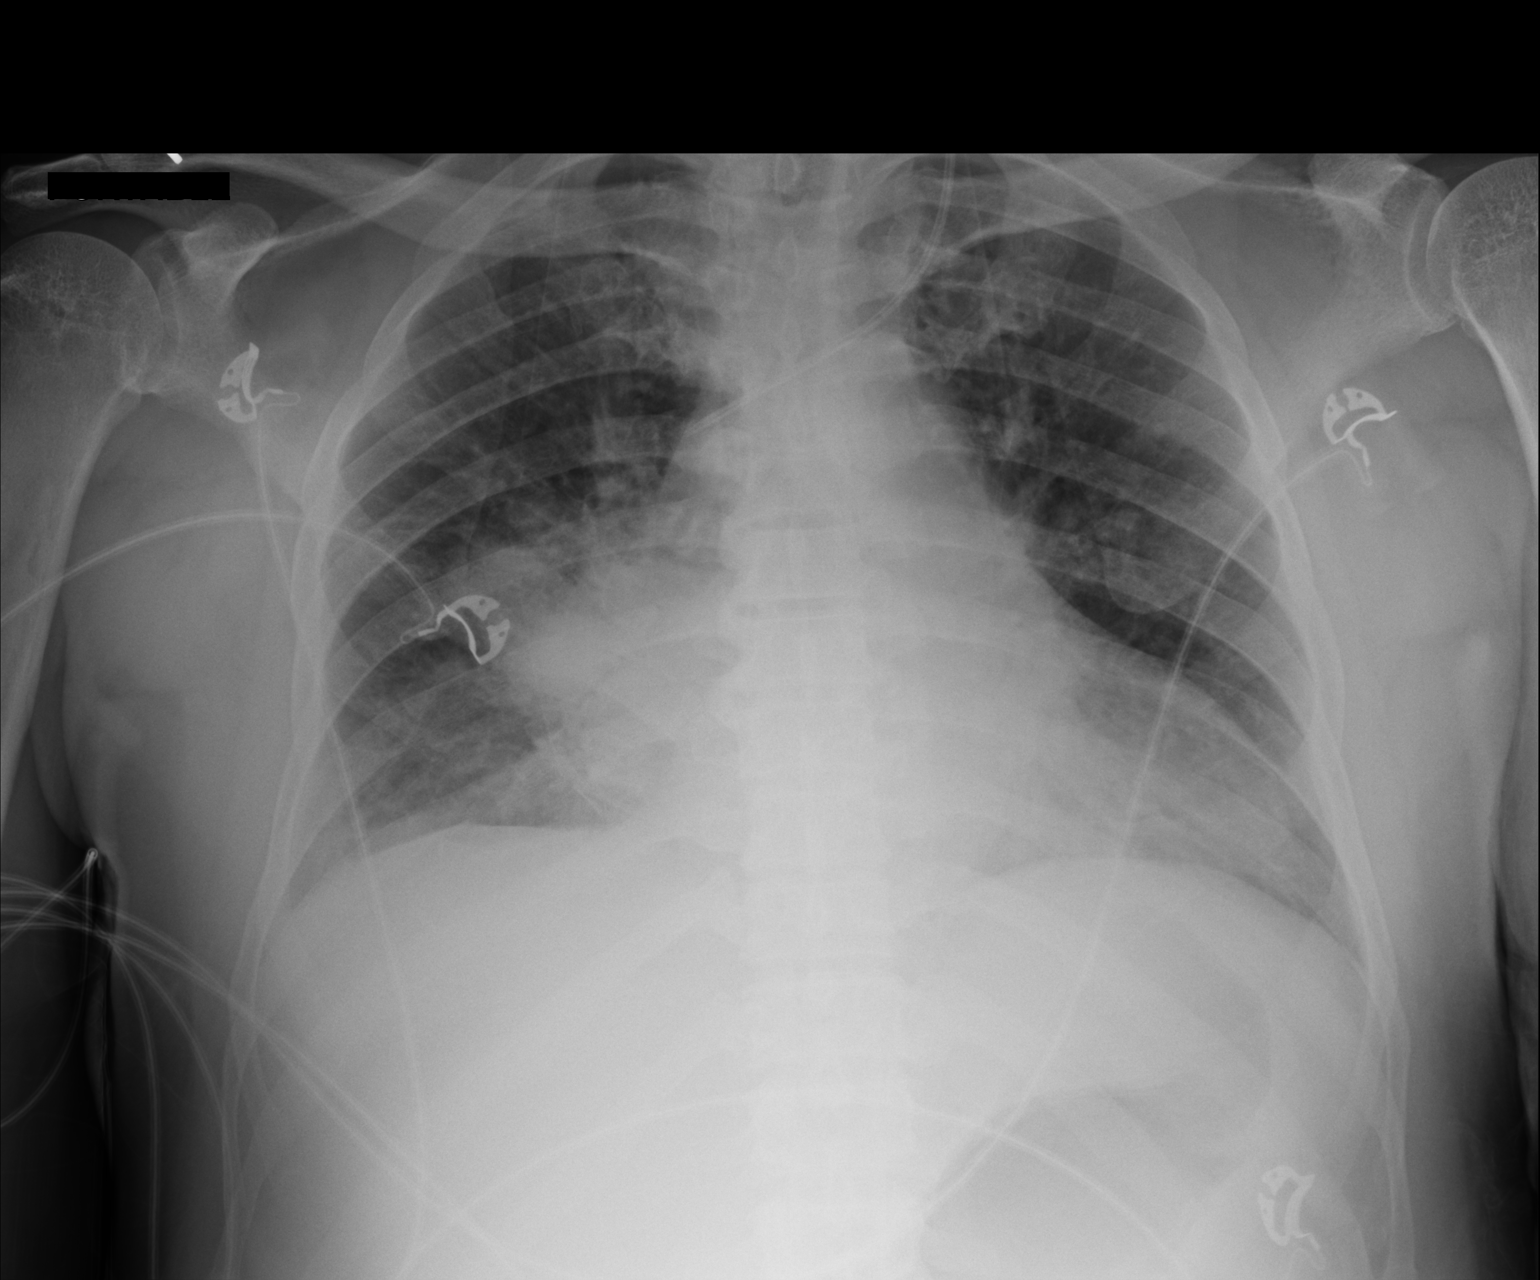

[1 of 1 positions shown; findings below may reference images not displayed]

FINDINGS: Left IJ line the crosses the midline to the lateral wall of the
proximal left cyst SVC. No pneumothorax.

Cardiomegaly with increased right hilar an and infrahilar opacity.
The Low lung volumes are present, causing crowding of the pulmonary
vasculature. Interstitial accentuation in the lungs is mildly
improved.
IMPRESSION: 1. Left IJ line tip: Upper SVC. The catheter tip is oriented
somewhat transversely against the lateral wall of the SVC, a
positioning which can predispose to increased risk of occlusion of
the catheter-consider advancing to 3 cm.
2. Cardiomegaly with reduced interstitial accentuation.
3. Right hilar and infrahilar mass or pneumonia, slightly increased
in prominence. Follow up to clearance is recommended.

## 2015-03-13 IMAGING — DX DG CHEST 1V PORT
1 series · 1 of 1 positions shown · non-contrast
Comparison: None.

CLINICAL DATA: Fever.  Cough.

EXAM:
PORTABLE CHEST - 1 VIEW

[portable]
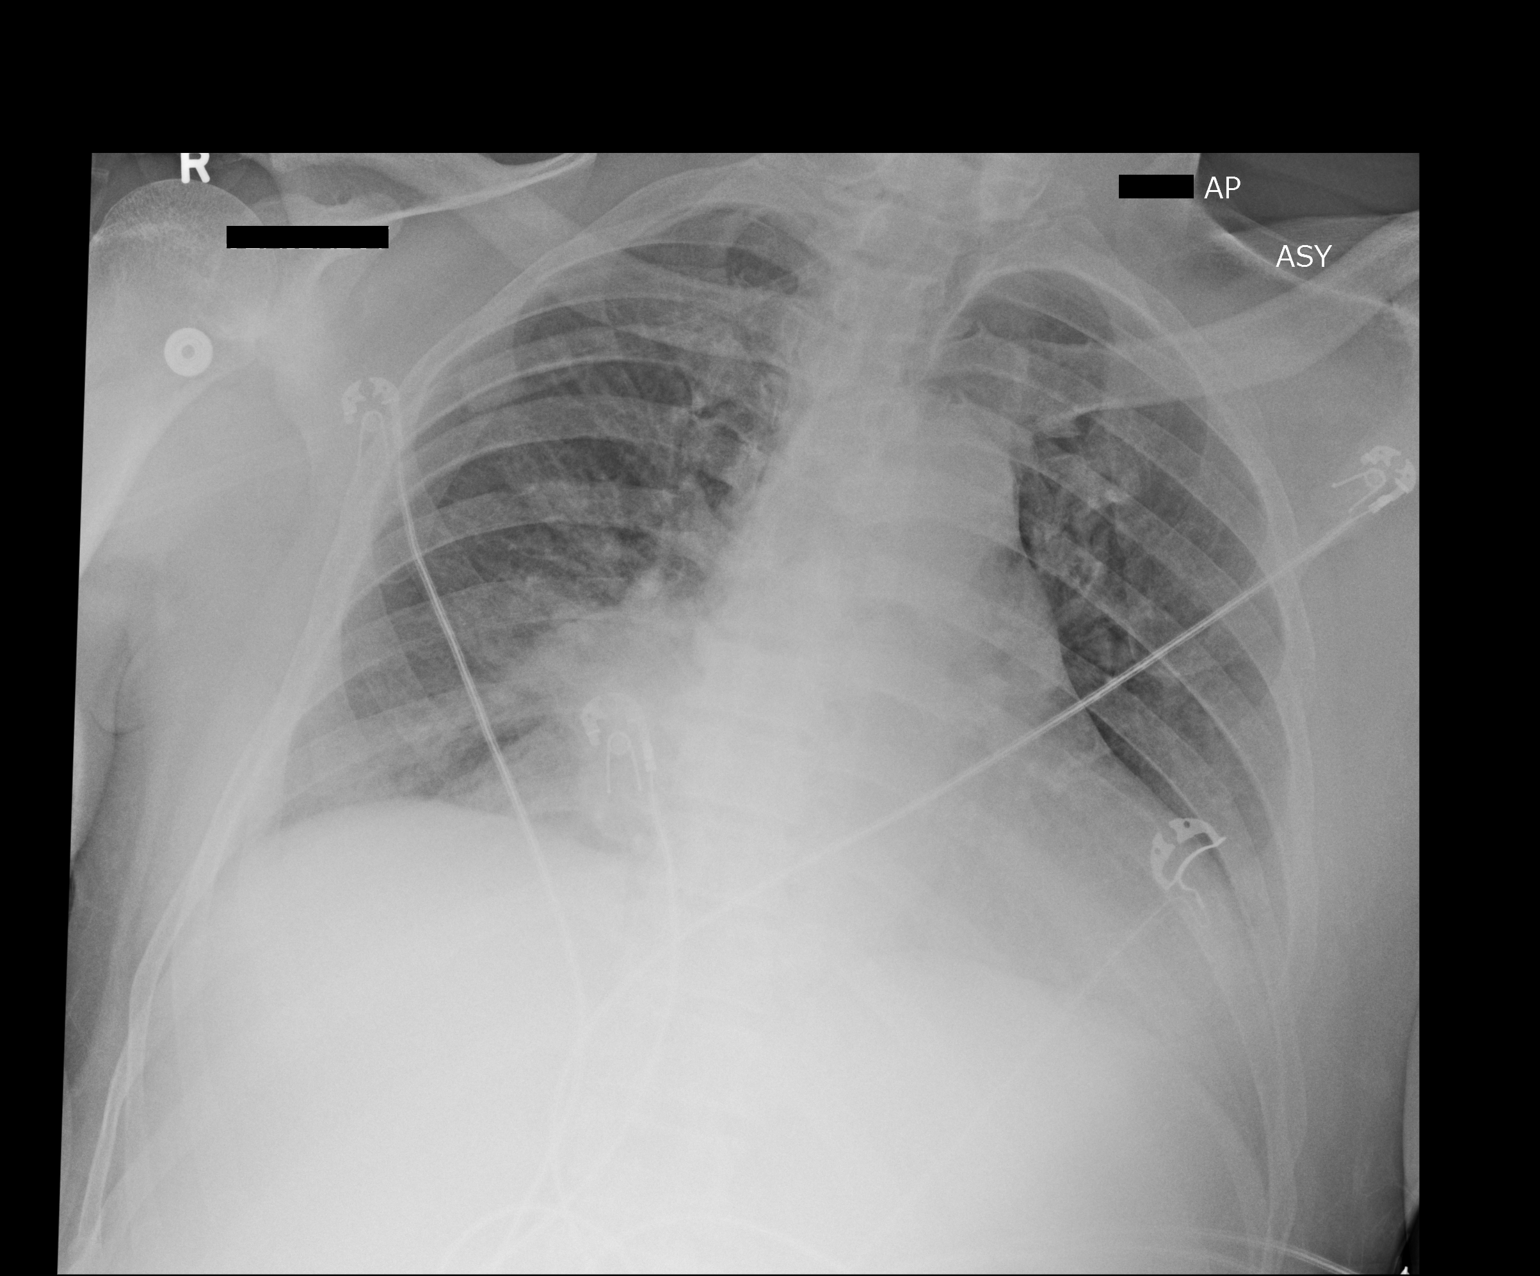

[1 of 1 positions shown; findings below may reference images not displayed]

FINDINGS: Abnormal right infrahilar and right middle lobe density obscures the
right cardiac border. Mild cardiomegaly. Mildly indistinct pulmonary
vasculature. No pleural effusion identified.
IMPRESSION: 1. Abnormal right infrahilar opacity probably reflects pneumonia
given the patient's history of fever and cough. Followup chest
radiography is recommended in 4 weeks time to ensure resolution and
exclude underlying malignancy.
2. Mild cardiomegaly with suspected low-level pulmonary venous
hypertension.

## 2015-03-14 IMAGING — CR DG CHEST 1V PORT
1 series · 1 of 1 positions shown · non-contrast
Comparison: 08/02/2013

CLINICAL DATA: Infiltrates

EXAM:
PORTABLE CHEST - 1 VIEW

[AP]
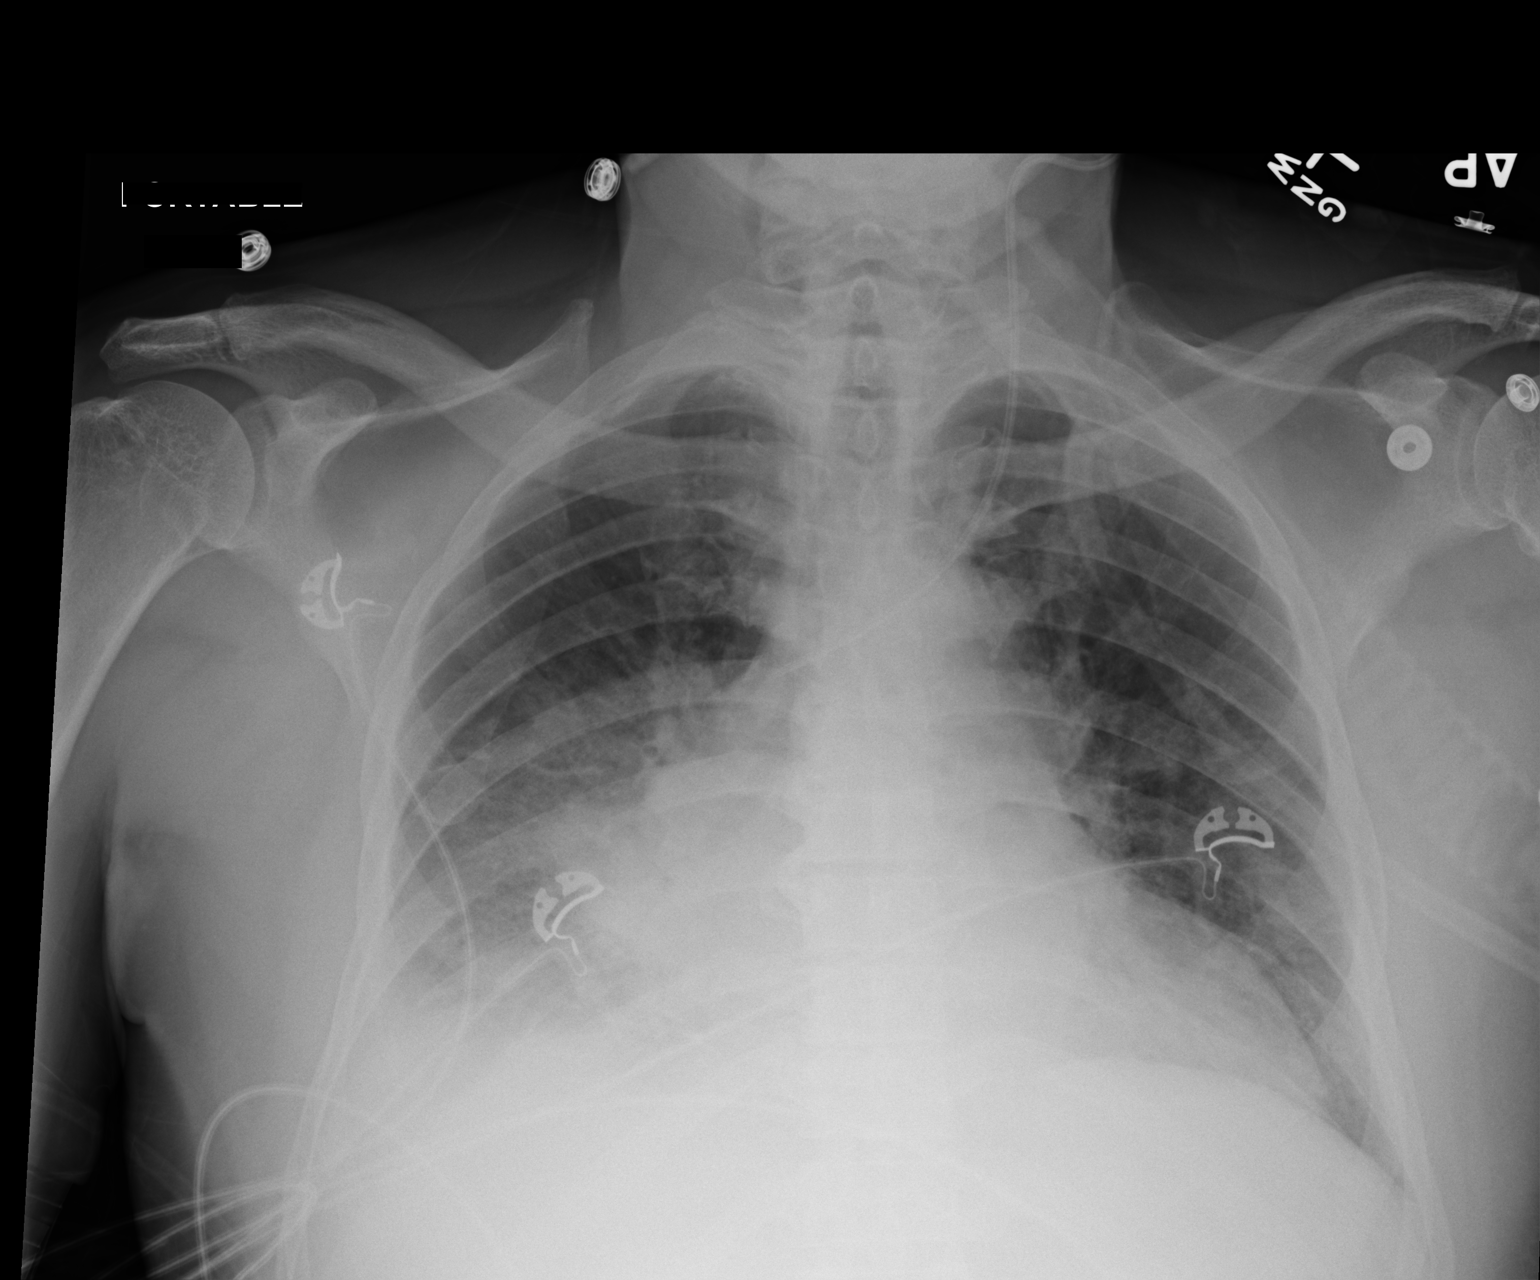

[1 of 1 positions shown; findings below may reference images not displayed]

FINDINGS: Cardiac shadow is stable. Persistent right perihilar mass lesion is
noted. There is been interval increase in the degree of right-sided
pleural effusion and right basilar infiltrate likely related to the
underlying lesion. A left central venous line is again noted and
stable. No other focal abnormality is seen.
IMPRESSION: Increasing right basilar infiltrate and effusion when compared with
the previous day.

## 2015-03-18 DIAGNOSIS — Z23 Encounter for immunization: Secondary | ICD-10-CM | POA: Diagnosis not present

## 2015-03-18 DIAGNOSIS — N529 Male erectile dysfunction, unspecified: Secondary | ICD-10-CM | POA: Diagnosis not present

## 2015-03-18 DIAGNOSIS — D696 Thrombocytopenia, unspecified: Secondary | ICD-10-CM | POA: Diagnosis not present

## 2015-03-18 DIAGNOSIS — E785 Hyperlipidemia, unspecified: Secondary | ICD-10-CM | POA: Diagnosis not present

## 2015-03-18 DIAGNOSIS — J309 Allergic rhinitis, unspecified: Secondary | ICD-10-CM | POA: Diagnosis not present

## 2015-03-18 DIAGNOSIS — I10 Essential (primary) hypertension: Secondary | ICD-10-CM | POA: Diagnosis not present

## 2015-09-26 DIAGNOSIS — Z125 Encounter for screening for malignant neoplasm of prostate: Secondary | ICD-10-CM | POA: Diagnosis not present

## 2015-09-26 DIAGNOSIS — Z0000 Encounter for general adult medical examination without abnormal findings: Secondary | ICD-10-CM | POA: Diagnosis not present

## 2015-09-26 DIAGNOSIS — Z1389 Encounter for screening for other disorder: Secondary | ICD-10-CM | POA: Diagnosis not present

## 2015-09-26 DIAGNOSIS — E785 Hyperlipidemia, unspecified: Secondary | ICD-10-CM | POA: Diagnosis not present

## 2015-09-26 DIAGNOSIS — Z1211 Encounter for screening for malignant neoplasm of colon: Secondary | ICD-10-CM | POA: Diagnosis not present

## 2015-09-26 DIAGNOSIS — I10 Essential (primary) hypertension: Secondary | ICD-10-CM | POA: Diagnosis not present

## 2015-09-26 DIAGNOSIS — J309 Allergic rhinitis, unspecified: Secondary | ICD-10-CM | POA: Diagnosis not present

## 2015-09-26 DIAGNOSIS — N529 Male erectile dysfunction, unspecified: Secondary | ICD-10-CM | POA: Diagnosis not present

## 2015-09-26 DIAGNOSIS — D696 Thrombocytopenia, unspecified: Secondary | ICD-10-CM | POA: Diagnosis not present

## 2016-04-09 DIAGNOSIS — I10 Essential (primary) hypertension: Secondary | ICD-10-CM | POA: Diagnosis not present

## 2016-04-09 DIAGNOSIS — E785 Hyperlipidemia, unspecified: Secondary | ICD-10-CM | POA: Diagnosis not present

## 2016-04-09 DIAGNOSIS — J309 Allergic rhinitis, unspecified: Secondary | ICD-10-CM | POA: Diagnosis not present

## 2016-04-09 DIAGNOSIS — D696 Thrombocytopenia, unspecified: Secondary | ICD-10-CM | POA: Diagnosis not present

## 2016-04-09 DIAGNOSIS — N529 Male erectile dysfunction, unspecified: Secondary | ICD-10-CM | POA: Diagnosis not present

## 2016-05-25 DIAGNOSIS — H524 Presbyopia: Secondary | ICD-10-CM | POA: Diagnosis not present

## 2016-05-25 DIAGNOSIS — H5203 Hypermetropia, bilateral: Secondary | ICD-10-CM | POA: Diagnosis not present

## 2016-11-01 DIAGNOSIS — H9193 Unspecified hearing loss, bilateral: Secondary | ICD-10-CM | POA: Diagnosis not present

## 2016-11-01 DIAGNOSIS — N529 Male erectile dysfunction, unspecified: Secondary | ICD-10-CM | POA: Diagnosis not present

## 2016-11-01 DIAGNOSIS — E7800 Pure hypercholesterolemia, unspecified: Secondary | ICD-10-CM | POA: Diagnosis not present

## 2016-11-01 DIAGNOSIS — Z125 Encounter for screening for malignant neoplasm of prostate: Secondary | ICD-10-CM | POA: Diagnosis not present

## 2016-11-01 DIAGNOSIS — J309 Allergic rhinitis, unspecified: Secondary | ICD-10-CM | POA: Diagnosis not present

## 2016-11-01 DIAGNOSIS — Z0000 Encounter for general adult medical examination without abnormal findings: Secondary | ICD-10-CM | POA: Diagnosis not present

## 2016-11-01 DIAGNOSIS — D696 Thrombocytopenia, unspecified: Secondary | ICD-10-CM | POA: Diagnosis not present

## 2016-11-01 DIAGNOSIS — Z1211 Encounter for screening for malignant neoplasm of colon: Secondary | ICD-10-CM | POA: Diagnosis not present

## 2016-11-01 DIAGNOSIS — I10 Essential (primary) hypertension: Secondary | ICD-10-CM | POA: Diagnosis not present

## 2017-05-06 DIAGNOSIS — E7800 Pure hypercholesterolemia, unspecified: Secondary | ICD-10-CM | POA: Diagnosis not present

## 2017-05-06 DIAGNOSIS — J309 Allergic rhinitis, unspecified: Secondary | ICD-10-CM | POA: Diagnosis not present

## 2017-05-06 DIAGNOSIS — N529 Male erectile dysfunction, unspecified: Secondary | ICD-10-CM | POA: Diagnosis not present

## 2017-05-06 DIAGNOSIS — I10 Essential (primary) hypertension: Secondary | ICD-10-CM | POA: Diagnosis not present

## 2017-05-06 DIAGNOSIS — D696 Thrombocytopenia, unspecified: Secondary | ICD-10-CM | POA: Diagnosis not present

## 2017-05-06 DIAGNOSIS — H9193 Unspecified hearing loss, bilateral: Secondary | ICD-10-CM | POA: Diagnosis not present

## 2017-06-23 ENCOUNTER — Encounter: Payer: Self-pay | Admitting: Gastroenterology

## 2017-06-30 ENCOUNTER — Encounter: Payer: Self-pay | Admitting: Gastroenterology

## 2017-08-16 ENCOUNTER — Other Ambulatory Visit: Payer: Self-pay

## 2017-08-16 ENCOUNTER — Ambulatory Visit (AMBULATORY_SURGERY_CENTER): Payer: Self-pay

## 2017-08-16 VITALS — Ht 68.0 in | Wt 213.6 lb

## 2017-08-16 DIAGNOSIS — Z1211 Encounter for screening for malignant neoplasm of colon: Secondary | ICD-10-CM

## 2017-08-16 MED ORDER — NA SULFATE-K SULFATE-MG SULF 17.5-3.13-1.6 GM/177ML PO SOLN: 1.0000 | Freq: Once | ORAL | 0 refills | Status: AC

## 2017-08-16 NOTE — Progress Notes (Signed)
No egg or soy allergy known to patient  Pt has no history of history of surgery No diet pills per patient No home 02 use per patient  No blood thinners per patient  Pt denies issues with constipation  No A fib or A flutter  EMMI video sent to pt's e mail

## 2017-08-18 ENCOUNTER — Encounter: Payer: Self-pay | Admitting: Gastroenterology

## 2017-08-23 ENCOUNTER — Telehealth: Payer: Self-pay | Admitting: Gastroenterology

## 2017-08-23 NOTE — Telephone Encounter (Signed)
Called pt and left message to call our office back. Dale Hopkins

## 2017-08-23 NOTE — Telephone Encounter (Signed)
Spoke with patient's wife. She states even with a coupon the Suprep bowel prep would cost over $90.00. Informed wife if she could come to our office, we will leave a Suprep sample for her husband. Her husband will come in the am to pick up the prep.  Suprep lot #[id number], Exp 01/21 will be left at the front desk on the 4th floor. They will call back if they have further questions. Gwyndolyn Saxon

## 2017-08-23 NOTE — Telephone Encounter (Signed)
Patient prep for colon is too expensive and wants something cheaper called into [facility]. Pt had ov on 2.26.19 and procedure is set for 3.12.19

## 2017-08-30 ENCOUNTER — Ambulatory Visit (AMBULATORY_SURGERY_CENTER): Payer: PPO | Admitting: Gastroenterology

## 2017-08-30 ENCOUNTER — Other Ambulatory Visit: Payer: Self-pay

## 2017-08-30 ENCOUNTER — Encounter: Payer: Self-pay | Admitting: Gastroenterology

## 2017-08-30 VITALS — BP 114/75 | HR 78 | Temp 97.5°F | Resp 12 | Ht 68.0 in | Wt 213.0 lb

## 2017-08-30 DIAGNOSIS — Z1211 Encounter for screening for malignant neoplasm of colon: Secondary | ICD-10-CM | POA: Diagnosis not present

## 2017-08-30 DIAGNOSIS — Z1212 Encounter for screening for malignant neoplasm of rectum: Secondary | ICD-10-CM | POA: Diagnosis not present

## 2017-08-30 MED ORDER — SODIUM CHLORIDE 0.9 % IV SOLN: 500.0000 mL | Freq: Once | INTRAVENOUS | Status: AC

## 2017-08-30 NOTE — Progress Notes (Signed)
Spontaneous respirations throughout. VSS. Resting comfortably. To PACU on room air. Report to  RN. 

## 2017-08-30 NOTE — Progress Notes (Signed)
Pt's states no medical or surgical changes since previsit or office visit. 

## 2017-08-30 NOTE — Op Note (Signed)
[facility] Patient Name: Dale Hopkins Procedure Date: 08/30/2017 10:08 AM MRN: [id number] Endoscopist: Mallie Mussel L. Loletha Carrow , MD Age: 72 Referring MD:  Date of Birth: 1945-11-05 Gender: Male Account #: [id number] Procedure:                Colonoscopy Indications:              Screening for colorectal malignant neoplasm (no                            polyps 05/2007) Medicines:                Monitored Anesthesia Care Procedure:                Pre-Anesthesia Assessment:                           - Prior to the procedure, a History and Physical                            was performed, and patient medications and                            allergies were reviewed. The patient's tolerance of                            previous anesthesia was also reviewed. The risks                            and benefits of the procedure and the sedation                            options and risks were discussed with the patient.                            All questions were answered, and informed consent                            was obtained. Anticoagulants: The patient has taken                            aspirin. It was decided not to withhold this                            medication prior to the procedure. ASA Grade                            Assessment: II - A patient with mild systemic                            disease. After reviewing the risks and benefits,                            the patient was deemed in satisfactory condition to  undergo the procedure.                           After obtaining informed consent, the colonoscope                            was passed under direct vision. Throughout the                            procedure, the patient's blood pressure, pulse, and                            oxygen saturations were monitored continuously. The                            Colonoscope was introduced through the anus and   advanced to the the cecum, identified by                            appendiceal orifice and ileocecal valve. The                            colonoscopy was performed without difficulty. The                            patient tolerated the procedure well. The quality                            of the bowel preparation was good. The ileocecal                            valve, appendiceal orifice, and rectum were                            photographed. The quality of the bowel preparation                            was evaluated using the BBPS [facility] Bowel                            Preparation Scale) with scores of: Right Colon = 2,                            Transverse Colon = 2 and Left Colon = 2. The total                            BBPS score equals 6. Scope In: 10:09:55 AM Scope Out: 10:22:41 AM Scope Withdrawal Time: 0 hours 8 minutes 37 seconds  Total Procedure Duration: 0 hours 12 minutes 46 seconds  Findings:                 The perianal and digital rectal examinations were                            normal.  Many large-mouthed diverticula were found in the                            entire colon.                           Internal hemorrhoids were found. The hemorrhoids                            were Grade I (internal hemorrhoids that do not                            prolapse).                           The exam was otherwise without abnormality on                            direct and retroflexion views. Complications:            No immediate complications. Estimated Blood Loss:     Estimated blood loss: none. Impression:               - Diverticulosis in the entire examined colon.                           - Internal hemorrhoids.                           - The examination was otherwise normal on direct                            and retroflexion views.                           - No specimens collected. Recommendation:           - Patient has a  contact number available for                            emergencies. The signs and symptoms of potential                            delayed complications were discussed with the                            patient. Return to normal activities tomorrow.                            Written discharge instructions were provided to the                            patient.                           - Resume previous diet.                           -  Continue present medications.                           - No repeat screening colonoscopy due to age. Blannie Shedlock L. Loletha Carrow, MD 08/30/2017 10:27:14 AM This report has been signed electronically.

## 2017-08-30 NOTE — Patient Instructions (Signed)
YOU HAD AN ENDOSCOPIC PROCEDURE TODAY AT THE [facility]:   Refer to the procedure report that was given to you for any specific questions about what was found during the examination.  If the procedure report does not answer your questions, please call your gastroenterologist to clarify.  If you requested that your care partner not be given the details of your procedure findings, then the procedure report has been included in a sealed envelope for you to review at your convenience later.  YOU SHOULD EXPECT: Some feelings of bloating in the abdomen. Passage of more gas than usual.  Walking can help get rid of the air that was put into your GI tract during the procedure and reduce the bloating. If you had a lower endoscopy (such as a colonoscopy or flexible sigmoidoscopy) you may notice spotting of blood in your stool or on the toilet paper. If you underwent a bowel prep for your procedure, you may not have a normal bowel movement for a few days.  Please Note:  You might notice some irritation and congestion in your nose or some drainage.  This is from the oxygen used during your procedure.  There is no need for concern and it should clear up in a day or so.  SYMPTOMS TO REPORT IMMEDIATELY:   Following lower endoscopy (colonoscopy or flexible sigmoidoscopy):  Excessive amounts of blood in the stool  Significant tenderness or worsening of abdominal pains  Swelling of the abdomen that is new, acute  Fever of 100F or higher  For urgent or emergent issues, a gastroenterologist can be reached at any hour by calling [phone].   DIET:  We do recommend a small meal at first, but then you may proceed to your regular diet.  Drink plenty of fluids but you should avoid alcoholic beverages for 24 hours.  ACTIVITY:  You should plan to take it easy for the rest of today and you should NOT DRIVE or use heavy machinery until tomorrow (because of the sedation medicines used during the test).     FOLLOW UP: Our staff will call the number listed on your records the next business day following your procedure to check on you and address any questions or concerns that you may have regarding the information given to you following your procedure. If we do not reach you, we will leave a message.  However, if you are feeling well and you are not experiencing any problems, there is no need to return our call.  We will assume that you have returned to your regular daily activities without incident.  If any biopsies were taken you will be contacted by phone or by letter within the next 1-3 weeks.  Please call us at [phone] if you have not heard about the biopsies in 3 weeks.    SIGNATURES/CONFIDENTIALITY: You and/or your care partner have signed paperwork which will be entered into your electronic medical record.  These signatures attest to the fact that that the information above on your After Visit Summary has been reviewed and is understood.  Full responsibility of the confidentiality of this discharge information lies with you and/or your care-partner. 

## 2017-08-31 ENCOUNTER — Telehealth: Payer: Self-pay | Admitting: *Deleted

## 2017-08-31 ENCOUNTER — Telehealth: Payer: Self-pay

## 2017-08-31 NOTE — Telephone Encounter (Signed)
Left message

## 2017-08-31 NOTE — Telephone Encounter (Signed)
  Follow up Call-  Call back number 08/30/2017  Post procedure Call Back phone  # [phone]  Permission to leave phone message Yes  Some recent data might be hidden     No answer, left message.

## 2017-09-15 DIAGNOSIS — H524 Presbyopia: Secondary | ICD-10-CM | POA: Diagnosis not present

## 2017-09-15 DIAGNOSIS — H25013 Cortical age-related cataract, bilateral: Secondary | ICD-10-CM | POA: Diagnosis not present

## 2017-09-15 DIAGNOSIS — H2513 Age-related nuclear cataract, bilateral: Secondary | ICD-10-CM | POA: Diagnosis not present

## 2017-09-15 DIAGNOSIS — H5203 Hypermetropia, bilateral: Secondary | ICD-10-CM | POA: Diagnosis not present

## 2018-01-27 DIAGNOSIS — E7800 Pure hypercholesterolemia, unspecified: Secondary | ICD-10-CM | POA: Diagnosis not present

## 2018-01-27 DIAGNOSIS — D696 Thrombocytopenia, unspecified: Secondary | ICD-10-CM | POA: Diagnosis not present

## 2018-01-27 DIAGNOSIS — J309 Allergic rhinitis, unspecified: Secondary | ICD-10-CM | POA: Diagnosis not present

## 2018-01-27 DIAGNOSIS — I10 Essential (primary) hypertension: Secondary | ICD-10-CM | POA: Diagnosis not present

## 2018-01-27 DIAGNOSIS — Z0000 Encounter for general adult medical examination without abnormal findings: Secondary | ICD-10-CM | POA: Diagnosis not present

## 2018-01-27 DIAGNOSIS — Z125 Encounter for screening for malignant neoplasm of prostate: Secondary | ICD-10-CM | POA: Diagnosis not present

## 2018-01-27 DIAGNOSIS — Z1389 Encounter for screening for other disorder: Secondary | ICD-10-CM | POA: Diagnosis not present

## 2018-01-27 DIAGNOSIS — H9193 Unspecified hearing loss, bilateral: Secondary | ICD-10-CM | POA: Diagnosis not present

## 2018-01-27 DIAGNOSIS — N529 Male erectile dysfunction, unspecified: Secondary | ICD-10-CM | POA: Diagnosis not present

## 2018-01-27 DIAGNOSIS — Z1211 Encounter for screening for malignant neoplasm of colon: Secondary | ICD-10-CM | POA: Diagnosis not present

## 2018-05-01 DIAGNOSIS — I10 Essential (primary) hypertension: Secondary | ICD-10-CM | POA: Diagnosis not present

## 2018-05-01 DIAGNOSIS — H1013 Acute atopic conjunctivitis, bilateral: Secondary | ICD-10-CM | POA: Diagnosis not present

## 2018-07-03 DIAGNOSIS — J069 Acute upper respiratory infection, unspecified: Secondary | ICD-10-CM | POA: Diagnosis not present

## 2018-07-03 DIAGNOSIS — J329 Chronic sinusitis, unspecified: Secondary | ICD-10-CM | POA: Diagnosis not present

## 2018-07-24 DIAGNOSIS — G5601 Carpal tunnel syndrome, right upper limb: Secondary | ICD-10-CM | POA: Diagnosis not present

## 2018-07-24 DIAGNOSIS — I10 Essential (primary) hypertension: Secondary | ICD-10-CM | POA: Diagnosis not present

## 2018-07-24 DIAGNOSIS — E782 Mixed hyperlipidemia: Secondary | ICD-10-CM | POA: Diagnosis not present

## 2018-10-23 DIAGNOSIS — I10 Essential (primary) hypertension: Secondary | ICD-10-CM | POA: Diagnosis not present

## 2018-10-23 DIAGNOSIS — E782 Mixed hyperlipidemia: Secondary | ICD-10-CM | POA: Diagnosis not present

## 2018-10-26 DIAGNOSIS — R740 Nonspecific elevation of levels of transaminase and lactic acid dehydrogenase [LDH]: Secondary | ICD-10-CM | POA: Diagnosis not present

## 2018-10-26 DIAGNOSIS — I10 Essential (primary) hypertension: Secondary | ICD-10-CM | POA: Diagnosis not present

## 2018-10-26 DIAGNOSIS — E782 Mixed hyperlipidemia: Secondary | ICD-10-CM | POA: Diagnosis not present

## 2018-12-20 DIAGNOSIS — E782 Mixed hyperlipidemia: Secondary | ICD-10-CM | POA: Diagnosis not present

## 2018-12-20 DIAGNOSIS — I10 Essential (primary) hypertension: Secondary | ICD-10-CM | POA: Diagnosis not present

## 2018-12-20 DIAGNOSIS — J302 Other seasonal allergic rhinitis: Secondary | ICD-10-CM | POA: Diagnosis not present

## 2018-12-20 DIAGNOSIS — E7800 Pure hypercholesterolemia, unspecified: Secondary | ICD-10-CM | POA: Diagnosis not present

## 2019-02-08 DIAGNOSIS — Z0000 Encounter for general adult medical examination without abnormal findings: Secondary | ICD-10-CM | POA: Diagnosis not present

## 2019-02-08 DIAGNOSIS — I10 Essential (primary) hypertension: Secondary | ICD-10-CM | POA: Diagnosis not present

## 2019-02-08 DIAGNOSIS — Z1389 Encounter for screening for other disorder: Secondary | ICD-10-CM | POA: Diagnosis not present

## 2019-02-08 DIAGNOSIS — Z1211 Encounter for screening for malignant neoplasm of colon: Secondary | ICD-10-CM | POA: Diagnosis not present

## 2019-02-08 DIAGNOSIS — R413 Other amnesia: Secondary | ICD-10-CM | POA: Diagnosis not present

## 2019-02-08 DIAGNOSIS — N529 Male erectile dysfunction, unspecified: Secondary | ICD-10-CM | POA: Diagnosis not present

## 2019-02-08 DIAGNOSIS — D696 Thrombocytopenia, unspecified: Secondary | ICD-10-CM | POA: Diagnosis not present

## 2019-02-08 DIAGNOSIS — J309 Allergic rhinitis, unspecified: Secondary | ICD-10-CM | POA: Diagnosis not present

## 2019-02-08 DIAGNOSIS — Z125 Encounter for screening for malignant neoplasm of prostate: Secondary | ICD-10-CM | POA: Diagnosis not present

## 2019-02-08 DIAGNOSIS — E7800 Pure hypercholesterolemia, unspecified: Secondary | ICD-10-CM | POA: Diagnosis not present

## 2019-02-21 ENCOUNTER — Encounter: Payer: Self-pay | Admitting: Neurology

## 2019-02-22 DIAGNOSIS — H5203 Hypermetropia, bilateral: Secondary | ICD-10-CM | POA: Diagnosis not present

## 2019-02-22 DIAGNOSIS — H524 Presbyopia: Secondary | ICD-10-CM | POA: Diagnosis not present

## 2019-02-23 DIAGNOSIS — D696 Thrombocytopenia, unspecified: Secondary | ICD-10-CM | POA: Diagnosis not present

## 2019-02-23 DIAGNOSIS — R740 Nonspecific elevation of levels of transaminase and lactic acid dehydrogenase [LDH]: Secondary | ICD-10-CM | POA: Diagnosis not present

## 2019-02-23 DIAGNOSIS — E782 Mixed hyperlipidemia: Secondary | ICD-10-CM | POA: Diagnosis not present

## 2019-02-23 DIAGNOSIS — E7800 Pure hypercholesterolemia, unspecified: Secondary | ICD-10-CM | POA: Diagnosis not present

## 2019-04-13 ENCOUNTER — Ambulatory Visit: Payer: PPO | Admitting: Neurology

## 2019-04-16 ENCOUNTER — Encounter: Payer: Self-pay | Admitting: Neurology

## 2019-05-08 ENCOUNTER — Ambulatory Visit: Payer: PPO | Admitting: Neurology

## 2019-06-12 ENCOUNTER — Encounter: Payer: Self-pay | Admitting: Neurology

## 2019-06-12 ENCOUNTER — Ambulatory Visit: Payer: PPO | Admitting: Neurology

## 2019-07-05 ENCOUNTER — Ambulatory Visit: Payer: PPO | Admitting: Neurology

## 2019-07-13 DIAGNOSIS — Z20822 Contact with and (suspected) exposure to covid-19: Secondary | ICD-10-CM | POA: Diagnosis not present

## 2019-07-28 DIAGNOSIS — Z20828 Contact with and (suspected) exposure to other viral communicable diseases: Secondary | ICD-10-CM | POA: Diagnosis not present

## 2019-08-10 DIAGNOSIS — R413 Other amnesia: Secondary | ICD-10-CM | POA: Diagnosis not present

## 2019-08-10 DIAGNOSIS — I10 Essential (primary) hypertension: Secondary | ICD-10-CM | POA: Diagnosis not present

## 2019-08-10 DIAGNOSIS — J309 Allergic rhinitis, unspecified: Secondary | ICD-10-CM | POA: Diagnosis not present

## 2019-08-10 DIAGNOSIS — E7800 Pure hypercholesterolemia, unspecified: Secondary | ICD-10-CM | POA: Diagnosis not present

## 2019-08-10 DIAGNOSIS — D696 Thrombocytopenia, unspecified: Secondary | ICD-10-CM | POA: Diagnosis not present

## 2019-08-10 DIAGNOSIS — N529 Male erectile dysfunction, unspecified: Secondary | ICD-10-CM | POA: Diagnosis not present

## 2019-08-31 ENCOUNTER — Ambulatory Visit: Payer: PPO | Admitting: Neurology

## 2019-09-03 ENCOUNTER — Ambulatory Visit: Payer: PPO | Admitting: Neurology

## 2020-02-12 DIAGNOSIS — E7800 Pure hypercholesterolemia, unspecified: Secondary | ICD-10-CM | POA: Diagnosis not present

## 2020-02-12 DIAGNOSIS — I10 Essential (primary) hypertension: Secondary | ICD-10-CM | POA: Diagnosis not present

## 2020-02-12 DIAGNOSIS — E782 Mixed hyperlipidemia: Secondary | ICD-10-CM | POA: Diagnosis not present

## 2020-02-15 DIAGNOSIS — E7800 Pure hypercholesterolemia, unspecified: Secondary | ICD-10-CM | POA: Diagnosis not present

## 2020-02-15 DIAGNOSIS — Z1389 Encounter for screening for other disorder: Secondary | ICD-10-CM | POA: Diagnosis not present

## 2020-02-15 DIAGNOSIS — N529 Male erectile dysfunction, unspecified: Secondary | ICD-10-CM | POA: Diagnosis not present

## 2020-02-15 DIAGNOSIS — Z0000 Encounter for general adult medical examination without abnormal findings: Secondary | ICD-10-CM | POA: Diagnosis not present

## 2020-02-15 DIAGNOSIS — J309 Allergic rhinitis, unspecified: Secondary | ICD-10-CM | POA: Diagnosis not present

## 2020-02-15 DIAGNOSIS — Z1211 Encounter for screening for malignant neoplasm of colon: Secondary | ICD-10-CM | POA: Diagnosis not present

## 2020-02-15 DIAGNOSIS — Z125 Encounter for screening for malignant neoplasm of prostate: Secondary | ICD-10-CM | POA: Diagnosis not present

## 2020-02-15 DIAGNOSIS — R413 Other amnesia: Secondary | ICD-10-CM | POA: Diagnosis not present

## 2020-02-15 DIAGNOSIS — D696 Thrombocytopenia, unspecified: Secondary | ICD-10-CM | POA: Diagnosis not present

## 2020-02-15 DIAGNOSIS — I10 Essential (primary) hypertension: Secondary | ICD-10-CM | POA: Diagnosis not present

## 2020-03-14 DIAGNOSIS — Z23 Encounter for immunization: Secondary | ICD-10-CM | POA: Diagnosis not present

## 2020-03-14 DIAGNOSIS — K219 Gastro-esophageal reflux disease without esophagitis: Secondary | ICD-10-CM | POA: Diagnosis not present

## 2020-03-14 DIAGNOSIS — I10 Essential (primary) hypertension: Secondary | ICD-10-CM | POA: Diagnosis not present

## 2020-03-18 DIAGNOSIS — E7800 Pure hypercholesterolemia, unspecified: Secondary | ICD-10-CM | POA: Diagnosis not present

## 2020-03-18 DIAGNOSIS — E782 Mixed hyperlipidemia: Secondary | ICD-10-CM | POA: Diagnosis not present

## 2020-03-18 DIAGNOSIS — I10 Essential (primary) hypertension: Secondary | ICD-10-CM | POA: Diagnosis not present

## 2020-03-26 DIAGNOSIS — H43393 Other vitreous opacities, bilateral: Secondary | ICD-10-CM | POA: Diagnosis not present

## 2020-03-26 DIAGNOSIS — H524 Presbyopia: Secondary | ICD-10-CM | POA: Diagnosis not present

## 2020-03-26 DIAGNOSIS — H5201 Hypermetropia, right eye: Secondary | ICD-10-CM | POA: Diagnosis not present

## 2020-03-26 DIAGNOSIS — H2513 Age-related nuclear cataract, bilateral: Secondary | ICD-10-CM | POA: Diagnosis not present

## 2020-03-26 DIAGNOSIS — H52221 Regular astigmatism, right eye: Secondary | ICD-10-CM | POA: Diagnosis not present

## 2020-03-31 DIAGNOSIS — E782 Mixed hyperlipidemia: Secondary | ICD-10-CM | POA: Diagnosis not present

## 2020-03-31 DIAGNOSIS — I10 Essential (primary) hypertension: Secondary | ICD-10-CM | POA: Diagnosis not present

## 2020-03-31 DIAGNOSIS — E7800 Pure hypercholesterolemia, unspecified: Secondary | ICD-10-CM | POA: Diagnosis not present

## 2020-05-09 DIAGNOSIS — H2513 Age-related nuclear cataract, bilateral: Secondary | ICD-10-CM | POA: Diagnosis not present

## 2020-05-09 DIAGNOSIS — H35373 Puckering of macula, bilateral: Secondary | ICD-10-CM | POA: Diagnosis not present

## 2020-05-09 DIAGNOSIS — H5703 Miosis: Secondary | ICD-10-CM | POA: Diagnosis not present

## 2020-05-09 DIAGNOSIS — H43393 Other vitreous opacities, bilateral: Secondary | ICD-10-CM | POA: Diagnosis not present

## 2020-05-09 DIAGNOSIS — H52212 Irregular astigmatism, left eye: Secondary | ICD-10-CM | POA: Diagnosis not present

## 2020-06-06 DIAGNOSIS — E7800 Pure hypercholesterolemia, unspecified: Secondary | ICD-10-CM | POA: Diagnosis not present

## 2020-06-06 DIAGNOSIS — I10 Essential (primary) hypertension: Secondary | ICD-10-CM | POA: Diagnosis not present

## 2020-06-06 DIAGNOSIS — E782 Mixed hyperlipidemia: Secondary | ICD-10-CM | POA: Diagnosis not present

## 2020-06-06 DIAGNOSIS — K219 Gastro-esophageal reflux disease without esophagitis: Secondary | ICD-10-CM | POA: Diagnosis not present

## 2020-08-21 DIAGNOSIS — I10 Essential (primary) hypertension: Secondary | ICD-10-CM | POA: Diagnosis not present

## 2020-08-21 DIAGNOSIS — D696 Thrombocytopenia, unspecified: Secondary | ICD-10-CM | POA: Diagnosis not present

## 2020-08-21 DIAGNOSIS — R413 Other amnesia: Secondary | ICD-10-CM | POA: Diagnosis not present

## 2020-08-21 DIAGNOSIS — J309 Allergic rhinitis, unspecified: Secondary | ICD-10-CM | POA: Diagnosis not present

## 2020-08-21 DIAGNOSIS — N529 Male erectile dysfunction, unspecified: Secondary | ICD-10-CM | POA: Diagnosis not present

## 2020-08-21 DIAGNOSIS — E7800 Pure hypercholesterolemia, unspecified: Secondary | ICD-10-CM | POA: Diagnosis not present

## 2020-08-21 DIAGNOSIS — K219 Gastro-esophageal reflux disease without esophagitis: Secondary | ICD-10-CM | POA: Diagnosis not present

## 2020-08-22 DIAGNOSIS — E7800 Pure hypercholesterolemia, unspecified: Secondary | ICD-10-CM | POA: Diagnosis not present

## 2020-08-22 DIAGNOSIS — I10 Essential (primary) hypertension: Secondary | ICD-10-CM | POA: Diagnosis not present

## 2020-08-22 DIAGNOSIS — K219 Gastro-esophageal reflux disease without esophagitis: Secondary | ICD-10-CM | POA: Diagnosis not present

## 2020-08-22 DIAGNOSIS — E782 Mixed hyperlipidemia: Secondary | ICD-10-CM | POA: Diagnosis not present

## 2020-10-17 DIAGNOSIS — E7800 Pure hypercholesterolemia, unspecified: Secondary | ICD-10-CM | POA: Diagnosis not present

## 2020-10-17 DIAGNOSIS — E782 Mixed hyperlipidemia: Secondary | ICD-10-CM | POA: Diagnosis not present

## 2020-10-17 DIAGNOSIS — K219 Gastro-esophageal reflux disease without esophagitis: Secondary | ICD-10-CM | POA: Diagnosis not present

## 2020-10-17 DIAGNOSIS — I10 Essential (primary) hypertension: Secondary | ICD-10-CM | POA: Diagnosis not present

## 2020-12-05 DIAGNOSIS — Z20822 Contact with and (suspected) exposure to covid-19: Secondary | ICD-10-CM | POA: Diagnosis not present

## 2020-12-05 DIAGNOSIS — Z7184 Encounter for health counseling related to travel: Secondary | ICD-10-CM | POA: Diagnosis not present

## 2021-01-01 DIAGNOSIS — R051 Acute cough: Secondary | ICD-10-CM | POA: Diagnosis not present

## 2021-01-01 DIAGNOSIS — Z20822 Contact with and (suspected) exposure to covid-19: Secondary | ICD-10-CM | POA: Diagnosis not present

## 2021-01-01 DIAGNOSIS — R197 Diarrhea, unspecified: Secondary | ICD-10-CM | POA: Diagnosis not present

## 2021-01-01 DIAGNOSIS — R0981 Nasal congestion: Secondary | ICD-10-CM | POA: Diagnosis not present

## 2021-01-01 DIAGNOSIS — Z03818 Encounter for observation for suspected exposure to other biological agents ruled out: Secondary | ICD-10-CM | POA: Diagnosis not present

## 2021-03-12 DIAGNOSIS — Z1211 Encounter for screening for malignant neoplasm of colon: Secondary | ICD-10-CM | POA: Diagnosis not present

## 2021-03-12 DIAGNOSIS — E7800 Pure hypercholesterolemia, unspecified: Secondary | ICD-10-CM | POA: Diagnosis not present

## 2021-03-12 DIAGNOSIS — D696 Thrombocytopenia, unspecified: Secondary | ICD-10-CM | POA: Diagnosis not present

## 2021-03-12 DIAGNOSIS — K219 Gastro-esophageal reflux disease without esophagitis: Secondary | ICD-10-CM | POA: Diagnosis not present

## 2021-03-12 DIAGNOSIS — Z0000 Encounter for general adult medical examination without abnormal findings: Secondary | ICD-10-CM | POA: Diagnosis not present

## 2021-03-12 DIAGNOSIS — N529 Male erectile dysfunction, unspecified: Secondary | ICD-10-CM | POA: Diagnosis not present

## 2021-03-12 DIAGNOSIS — I10 Essential (primary) hypertension: Secondary | ICD-10-CM | POA: Diagnosis not present

## 2021-03-12 DIAGNOSIS — J309 Allergic rhinitis, unspecified: Secondary | ICD-10-CM | POA: Diagnosis not present

## 2021-03-12 DIAGNOSIS — Z1389 Encounter for screening for other disorder: Secondary | ICD-10-CM | POA: Diagnosis not present

## 2021-03-12 DIAGNOSIS — R413 Other amnesia: Secondary | ICD-10-CM | POA: Diagnosis not present

## 2021-09-11 DIAGNOSIS — D696 Thrombocytopenia, unspecified: Secondary | ICD-10-CM | POA: Diagnosis not present

## 2021-09-11 DIAGNOSIS — N529 Male erectile dysfunction, unspecified: Secondary | ICD-10-CM | POA: Diagnosis not present

## 2021-09-11 DIAGNOSIS — E7800 Pure hypercholesterolemia, unspecified: Secondary | ICD-10-CM | POA: Diagnosis not present

## 2021-09-11 DIAGNOSIS — J309 Allergic rhinitis, unspecified: Secondary | ICD-10-CM | POA: Diagnosis not present

## 2021-09-11 DIAGNOSIS — K219 Gastro-esophageal reflux disease without esophagitis: Secondary | ICD-10-CM | POA: Diagnosis not present

## 2021-09-11 DIAGNOSIS — R413 Other amnesia: Secondary | ICD-10-CM | POA: Diagnosis not present

## 2021-09-11 DIAGNOSIS — I10 Essential (primary) hypertension: Secondary | ICD-10-CM | POA: Diagnosis not present

## 2021-10-09 DIAGNOSIS — H5703 Miosis: Secondary | ICD-10-CM | POA: Diagnosis not present

## 2021-10-09 DIAGNOSIS — H2513 Age-related nuclear cataract, bilateral: Secondary | ICD-10-CM | POA: Diagnosis not present

## 2021-10-09 DIAGNOSIS — H35373 Puckering of macula, bilateral: Secondary | ICD-10-CM | POA: Diagnosis not present

## 2021-10-09 DIAGNOSIS — H52212 Irregular astigmatism, left eye: Secondary | ICD-10-CM | POA: Diagnosis not present

## 2021-10-09 DIAGNOSIS — H43393 Other vitreous opacities, bilateral: Secondary | ICD-10-CM | POA: Diagnosis not present

## 2021-10-21 DIAGNOSIS — H2513 Age-related nuclear cataract, bilateral: Secondary | ICD-10-CM | POA: Diagnosis not present

## 2021-10-21 DIAGNOSIS — H2512 Age-related nuclear cataract, left eye: Secondary | ICD-10-CM | POA: Diagnosis not present

## 2021-11-24 DIAGNOSIS — H5703 Miosis: Secondary | ICD-10-CM | POA: Diagnosis not present

## 2021-11-24 DIAGNOSIS — H269 Unspecified cataract: Secondary | ICD-10-CM | POA: Diagnosis not present

## 2021-11-24 DIAGNOSIS — H2512 Age-related nuclear cataract, left eye: Secondary | ICD-10-CM | POA: Diagnosis not present

## 2021-12-03 DIAGNOSIS — H2511 Age-related nuclear cataract, right eye: Secondary | ICD-10-CM | POA: Diagnosis not present

## 2021-12-15 DIAGNOSIS — H2511 Age-related nuclear cataract, right eye: Secondary | ICD-10-CM | POA: Diagnosis not present

## 2021-12-15 DIAGNOSIS — H269 Unspecified cataract: Secondary | ICD-10-CM | POA: Diagnosis not present

## 2021-12-15 DIAGNOSIS — H5703 Miosis: Secondary | ICD-10-CM | POA: Diagnosis not present

## 2022-01-25 DIAGNOSIS — H524 Presbyopia: Secondary | ICD-10-CM | POA: Diagnosis not present

## 2022-01-25 DIAGNOSIS — H52209 Unspecified astigmatism, unspecified eye: Secondary | ICD-10-CM | POA: Diagnosis not present

## 2022-01-25 DIAGNOSIS — H5213 Myopia, bilateral: Secondary | ICD-10-CM | POA: Diagnosis not present

## 2022-01-25 DIAGNOSIS — H5203 Hypermetropia, bilateral: Secondary | ICD-10-CM | POA: Diagnosis not present

## 2022-03-28 DIAGNOSIS — J40 Bronchitis, not specified as acute or chronic: Secondary | ICD-10-CM | POA: Diagnosis not present

## 2022-03-28 DIAGNOSIS — R0981 Nasal congestion: Secondary | ICD-10-CM | POA: Diagnosis not present

## 2022-03-28 DIAGNOSIS — J329 Chronic sinusitis, unspecified: Secondary | ICD-10-CM | POA: Diagnosis not present

## 2022-03-28 DIAGNOSIS — R059 Cough, unspecified: Secondary | ICD-10-CM | POA: Diagnosis not present

## 2022-05-03 DIAGNOSIS — R413 Other amnesia: Secondary | ICD-10-CM | POA: Diagnosis not present

## 2022-05-03 DIAGNOSIS — J309 Allergic rhinitis, unspecified: Secondary | ICD-10-CM | POA: Diagnosis not present

## 2022-05-03 DIAGNOSIS — K219 Gastro-esophageal reflux disease without esophagitis: Secondary | ICD-10-CM | POA: Diagnosis not present

## 2022-05-03 DIAGNOSIS — N529 Male erectile dysfunction, unspecified: Secondary | ICD-10-CM | POA: Diagnosis not present

## 2022-05-03 DIAGNOSIS — Z0000 Encounter for general adult medical examination without abnormal findings: Secondary | ICD-10-CM | POA: Diagnosis not present

## 2022-05-03 DIAGNOSIS — Z1211 Encounter for screening for malignant neoplasm of colon: Secondary | ICD-10-CM | POA: Diagnosis not present

## 2022-05-03 DIAGNOSIS — D696 Thrombocytopenia, unspecified: Secondary | ICD-10-CM | POA: Diagnosis not present

## 2022-05-03 DIAGNOSIS — E7800 Pure hypercholesterolemia, unspecified: Secondary | ICD-10-CM | POA: Diagnosis not present

## 2022-05-03 DIAGNOSIS — I10 Essential (primary) hypertension: Secondary | ICD-10-CM | POA: Diagnosis not present

## 2022-08-02 DIAGNOSIS — E7800 Pure hypercholesterolemia, unspecified: Secondary | ICD-10-CM | POA: Diagnosis not present

## 2022-08-02 DIAGNOSIS — D696 Thrombocytopenia, unspecified: Secondary | ICD-10-CM | POA: Diagnosis not present

## 2022-08-02 DIAGNOSIS — M25519 Pain in unspecified shoulder: Secondary | ICD-10-CM | POA: Diagnosis not present

## 2022-08-02 DIAGNOSIS — I10 Essential (primary) hypertension: Secondary | ICD-10-CM | POA: Diagnosis not present

## 2022-08-14 DIAGNOSIS — G4709 Other insomnia: Secondary | ICD-10-CM | POA: Diagnosis not present

## 2022-08-14 DIAGNOSIS — R5383 Other fatigue: Secondary | ICD-10-CM | POA: Diagnosis not present

## 2022-08-17 DIAGNOSIS — M7541 Impingement syndrome of right shoulder: Secondary | ICD-10-CM | POA: Diagnosis not present

## 2022-08-17 DIAGNOSIS — M25511 Pain in right shoulder: Secondary | ICD-10-CM | POA: Diagnosis not present

## 2022-08-17 DIAGNOSIS — M7542 Impingement syndrome of left shoulder: Secondary | ICD-10-CM | POA: Diagnosis not present

## 2022-09-15 DIAGNOSIS — B349 Viral infection, unspecified: Secondary | ICD-10-CM | POA: Diagnosis not present

## 2022-09-15 DIAGNOSIS — R051 Acute cough: Secondary | ICD-10-CM | POA: Diagnosis not present

## 2022-10-26 DIAGNOSIS — M25532 Pain in left wrist: Secondary | ICD-10-CM | POA: Diagnosis not present

## 2022-11-02 DIAGNOSIS — I10 Essential (primary) hypertension: Secondary | ICD-10-CM | POA: Diagnosis not present

## 2022-11-02 DIAGNOSIS — K219 Gastro-esophageal reflux disease without esophagitis: Secondary | ICD-10-CM | POA: Diagnosis not present

## 2022-11-02 DIAGNOSIS — E7800 Pure hypercholesterolemia, unspecified: Secondary | ICD-10-CM | POA: Diagnosis not present

## 2022-11-02 DIAGNOSIS — R413 Other amnesia: Secondary | ICD-10-CM | POA: Diagnosis not present

## 2022-11-02 DIAGNOSIS — J309 Allergic rhinitis, unspecified: Secondary | ICD-10-CM | POA: Diagnosis not present

## 2022-11-02 DIAGNOSIS — N529 Male erectile dysfunction, unspecified: Secondary | ICD-10-CM | POA: Diagnosis not present

## 2022-11-02 DIAGNOSIS — D696 Thrombocytopenia, unspecified: Secondary | ICD-10-CM | POA: Diagnosis not present

## 2022-11-09 DIAGNOSIS — M25532 Pain in left wrist: Secondary | ICD-10-CM | POA: Diagnosis not present

## 2023-01-21 DIAGNOSIS — J018 Other acute sinusitis: Secondary | ICD-10-CM | POA: Diagnosis not present

## 2023-02-14 DIAGNOSIS — M7542 Impingement syndrome of left shoulder: Secondary | ICD-10-CM | POA: Diagnosis not present

## 2023-02-14 DIAGNOSIS — M7541 Impingement syndrome of right shoulder: Secondary | ICD-10-CM | POA: Diagnosis not present

## 2023-02-20 DIAGNOSIS — M7582 Other shoulder lesions, left shoulder: Secondary | ICD-10-CM | POA: Diagnosis not present

## 2023-02-20 DIAGNOSIS — M67814 Other specified disorders of tendon, left shoulder: Secondary | ICD-10-CM | POA: Diagnosis not present

## 2023-02-20 DIAGNOSIS — M7542 Impingement syndrome of left shoulder: Secondary | ICD-10-CM | POA: Diagnosis not present

## 2023-02-20 DIAGNOSIS — M75122 Complete rotator cuff tear or rupture of left shoulder, not specified as traumatic: Secondary | ICD-10-CM | POA: Diagnosis not present

## 2023-02-20 DIAGNOSIS — R531 Weakness: Secondary | ICD-10-CM | POA: Diagnosis not present

## 2023-02-20 DIAGNOSIS — M129 Arthropathy, unspecified: Secondary | ICD-10-CM | POA: Diagnosis not present

## 2023-02-20 DIAGNOSIS — M7522 Bicipital tendinitis, left shoulder: Secondary | ICD-10-CM | POA: Diagnosis not present

## 2023-03-07 DIAGNOSIS — S46012D Strain of muscle(s) and tendon(s) of the rotator cuff of left shoulder, subsequent encounter: Secondary | ICD-10-CM | POA: Diagnosis not present

## 2023-06-09 DIAGNOSIS — M75101 Unspecified rotator cuff tear or rupture of right shoulder, not specified as traumatic: Secondary | ICD-10-CM | POA: Diagnosis not present

## 2023-06-09 DIAGNOSIS — M19011 Primary osteoarthritis, right shoulder: Secondary | ICD-10-CM | POA: Diagnosis not present

## 2023-06-09 DIAGNOSIS — M25811 Other specified joint disorders, right shoulder: Secondary | ICD-10-CM | POA: Diagnosis not present

## 2023-06-10 DIAGNOSIS — E7800 Pure hypercholesterolemia, unspecified: Secondary | ICD-10-CM | POA: Diagnosis not present

## 2023-06-10 DIAGNOSIS — N529 Male erectile dysfunction, unspecified: Secondary | ICD-10-CM | POA: Diagnosis not present

## 2023-06-10 DIAGNOSIS — Z1331 Encounter for screening for depression: Secondary | ICD-10-CM | POA: Diagnosis not present

## 2023-06-10 DIAGNOSIS — I10 Essential (primary) hypertension: Secondary | ICD-10-CM | POA: Diagnosis not present

## 2023-06-10 DIAGNOSIS — K219 Gastro-esophageal reflux disease without esophagitis: Secondary | ICD-10-CM | POA: Diagnosis not present

## 2023-06-10 DIAGNOSIS — Z0000 Encounter for general adult medical examination without abnormal findings: Secondary | ICD-10-CM | POA: Diagnosis not present

## 2023-06-10 DIAGNOSIS — R413 Other amnesia: Secondary | ICD-10-CM | POA: Diagnosis not present

## 2023-06-10 DIAGNOSIS — M25562 Pain in left knee: Secondary | ICD-10-CM | POA: Diagnosis not present

## 2023-06-10 DIAGNOSIS — J309 Allergic rhinitis, unspecified: Secondary | ICD-10-CM | POA: Diagnosis not present

## 2023-06-17 DIAGNOSIS — M25711 Osteophyte, right shoulder: Secondary | ICD-10-CM | POA: Diagnosis not present

## 2023-06-17 DIAGNOSIS — G8918 Other acute postprocedural pain: Secondary | ICD-10-CM | POA: Diagnosis not present

## 2023-06-17 DIAGNOSIS — M25811 Other specified joint disorders, right shoulder: Secondary | ICD-10-CM | POA: Diagnosis not present

## 2023-06-17 DIAGNOSIS — M94211 Chondromalacia, right shoulder: Secondary | ICD-10-CM | POA: Diagnosis not present

## 2023-06-17 DIAGNOSIS — S46111A Strain of muscle, fascia and tendon of long head of biceps, right arm, initial encounter: Secondary | ICD-10-CM | POA: Diagnosis not present

## 2023-06-17 DIAGNOSIS — M75101 Unspecified rotator cuff tear or rupture of right shoulder, not specified as traumatic: Secondary | ICD-10-CM | POA: Diagnosis not present

## 2023-06-17 DIAGNOSIS — M19011 Primary osteoarthritis, right shoulder: Secondary | ICD-10-CM | POA: Diagnosis not present

## 2023-07-28 DIAGNOSIS — M25532 Pain in left wrist: Secondary | ICD-10-CM | POA: Diagnosis not present

## 2023-07-29 DIAGNOSIS — M25612 Stiffness of left shoulder, not elsewhere classified: Secondary | ICD-10-CM | POA: Diagnosis not present

## 2023-07-29 DIAGNOSIS — Z789 Other specified health status: Secondary | ICD-10-CM | POA: Diagnosis not present

## 2023-07-29 DIAGNOSIS — Z7982 Long term (current) use of aspirin: Secondary | ICD-10-CM | POA: Diagnosis not present

## 2023-07-29 DIAGNOSIS — M25512 Pain in left shoulder: Secondary | ICD-10-CM | POA: Diagnosis not present

## 2023-07-29 DIAGNOSIS — Z79899 Other long term (current) drug therapy: Secondary | ICD-10-CM | POA: Diagnosis not present

## 2023-07-29 DIAGNOSIS — Z7409 Other reduced mobility: Secondary | ICD-10-CM | POA: Diagnosis not present

## 2023-07-29 DIAGNOSIS — M6281 Muscle weakness (generalized): Secondary | ICD-10-CM | POA: Diagnosis not present

## 2023-07-29 DIAGNOSIS — Z4789 Encounter for other orthopedic aftercare: Secondary | ICD-10-CM | POA: Diagnosis not present

## 2023-08-04 DIAGNOSIS — Z4789 Encounter for other orthopedic aftercare: Secondary | ICD-10-CM | POA: Diagnosis not present

## 2023-08-04 DIAGNOSIS — Z79899 Other long term (current) drug therapy: Secondary | ICD-10-CM | POA: Diagnosis not present

## 2023-08-04 DIAGNOSIS — M25612 Stiffness of left shoulder, not elsewhere classified: Secondary | ICD-10-CM | POA: Diagnosis not present

## 2023-08-04 DIAGNOSIS — Z789 Other specified health status: Secondary | ICD-10-CM | POA: Diagnosis not present

## 2023-08-04 DIAGNOSIS — Z7982 Long term (current) use of aspirin: Secondary | ICD-10-CM | POA: Diagnosis not present

## 2023-08-04 DIAGNOSIS — M25512 Pain in left shoulder: Secondary | ICD-10-CM | POA: Diagnosis not present

## 2023-08-04 DIAGNOSIS — Z7409 Other reduced mobility: Secondary | ICD-10-CM | POA: Diagnosis not present

## 2023-08-04 DIAGNOSIS — M6281 Muscle weakness (generalized): Secondary | ICD-10-CM | POA: Diagnosis not present

## 2023-08-09 DIAGNOSIS — M25612 Stiffness of left shoulder, not elsewhere classified: Secondary | ICD-10-CM | POA: Diagnosis not present

## 2023-08-09 DIAGNOSIS — Z4789 Encounter for other orthopedic aftercare: Secondary | ICD-10-CM | POA: Diagnosis not present

## 2023-08-09 DIAGNOSIS — Z7409 Other reduced mobility: Secondary | ICD-10-CM | POA: Diagnosis not present

## 2023-08-09 DIAGNOSIS — Z7982 Long term (current) use of aspirin: Secondary | ICD-10-CM | POA: Diagnosis not present

## 2023-08-09 DIAGNOSIS — M25512 Pain in left shoulder: Secondary | ICD-10-CM | POA: Diagnosis not present

## 2023-08-09 DIAGNOSIS — Z789 Other specified health status: Secondary | ICD-10-CM | POA: Diagnosis not present

## 2023-08-09 DIAGNOSIS — Z79899 Other long term (current) drug therapy: Secondary | ICD-10-CM | POA: Diagnosis not present

## 2023-08-09 DIAGNOSIS — M6281 Muscle weakness (generalized): Secondary | ICD-10-CM | POA: Diagnosis not present

## 2023-08-16 DIAGNOSIS — Z789 Other specified health status: Secondary | ICD-10-CM | POA: Diagnosis not present

## 2023-08-16 DIAGNOSIS — M25512 Pain in left shoulder: Secondary | ICD-10-CM | POA: Diagnosis not present

## 2023-08-16 DIAGNOSIS — Z7409 Other reduced mobility: Secondary | ICD-10-CM | POA: Diagnosis not present

## 2023-08-16 DIAGNOSIS — M6281 Muscle weakness (generalized): Secondary | ICD-10-CM | POA: Diagnosis not present

## 2023-08-16 DIAGNOSIS — Z4789 Encounter for other orthopedic aftercare: Secondary | ICD-10-CM | POA: Diagnosis not present

## 2023-08-16 DIAGNOSIS — Z79899 Other long term (current) drug therapy: Secondary | ICD-10-CM | POA: Diagnosis not present

## 2023-08-16 DIAGNOSIS — Z7982 Long term (current) use of aspirin: Secondary | ICD-10-CM | POA: Diagnosis not present

## 2023-08-16 DIAGNOSIS — M25612 Stiffness of left shoulder, not elsewhere classified: Secondary | ICD-10-CM | POA: Diagnosis not present

## 2023-08-18 DIAGNOSIS — M6281 Muscle weakness (generalized): Secondary | ICD-10-CM | POA: Diagnosis not present

## 2023-08-18 DIAGNOSIS — Z7409 Other reduced mobility: Secondary | ICD-10-CM | POA: Diagnosis not present

## 2023-08-18 DIAGNOSIS — Z789 Other specified health status: Secondary | ICD-10-CM | POA: Diagnosis not present

## 2023-08-18 DIAGNOSIS — Z4789 Encounter for other orthopedic aftercare: Secondary | ICD-10-CM | POA: Diagnosis not present

## 2023-08-18 DIAGNOSIS — Z79899 Other long term (current) drug therapy: Secondary | ICD-10-CM | POA: Diagnosis not present

## 2023-08-18 DIAGNOSIS — M25612 Stiffness of left shoulder, not elsewhere classified: Secondary | ICD-10-CM | POA: Diagnosis not present

## 2023-08-18 DIAGNOSIS — Z7982 Long term (current) use of aspirin: Secondary | ICD-10-CM | POA: Diagnosis not present

## 2023-08-18 DIAGNOSIS — M25512 Pain in left shoulder: Secondary | ICD-10-CM | POA: Diagnosis not present

## 2023-08-23 DIAGNOSIS — M6281 Muscle weakness (generalized): Secondary | ICD-10-CM | POA: Diagnosis not present

## 2023-08-23 DIAGNOSIS — M25612 Stiffness of left shoulder, not elsewhere classified: Secondary | ICD-10-CM | POA: Diagnosis not present

## 2023-08-23 DIAGNOSIS — R531 Weakness: Secondary | ICD-10-CM | POA: Diagnosis not present

## 2023-08-23 DIAGNOSIS — Z79899 Other long term (current) drug therapy: Secondary | ICD-10-CM | POA: Diagnosis not present

## 2023-08-23 DIAGNOSIS — M25512 Pain in left shoulder: Secondary | ICD-10-CM | POA: Diagnosis not present

## 2023-08-23 DIAGNOSIS — Z4789 Encounter for other orthopedic aftercare: Secondary | ICD-10-CM | POA: Diagnosis not present

## 2023-08-23 DIAGNOSIS — Z7982 Long term (current) use of aspirin: Secondary | ICD-10-CM | POA: Diagnosis not present

## 2023-08-23 DIAGNOSIS — Z789 Other specified health status: Secondary | ICD-10-CM | POA: Diagnosis not present

## 2023-08-23 DIAGNOSIS — Z7409 Other reduced mobility: Secondary | ICD-10-CM | POA: Diagnosis not present

## 2023-08-25 DIAGNOSIS — Z789 Other specified health status: Secondary | ICD-10-CM | POA: Diagnosis not present

## 2023-08-25 DIAGNOSIS — M25612 Stiffness of left shoulder, not elsewhere classified: Secondary | ICD-10-CM | POA: Diagnosis not present

## 2023-08-25 DIAGNOSIS — Z79899 Other long term (current) drug therapy: Secondary | ICD-10-CM | POA: Diagnosis not present

## 2023-08-25 DIAGNOSIS — M6281 Muscle weakness (generalized): Secondary | ICD-10-CM | POA: Diagnosis not present

## 2023-08-25 DIAGNOSIS — R531 Weakness: Secondary | ICD-10-CM | POA: Diagnosis not present

## 2023-08-25 DIAGNOSIS — Z4789 Encounter for other orthopedic aftercare: Secondary | ICD-10-CM | POA: Diagnosis not present

## 2023-08-25 DIAGNOSIS — Z7409 Other reduced mobility: Secondary | ICD-10-CM | POA: Diagnosis not present

## 2023-08-25 DIAGNOSIS — M25512 Pain in left shoulder: Secondary | ICD-10-CM | POA: Diagnosis not present

## 2023-08-25 DIAGNOSIS — Z7982 Long term (current) use of aspirin: Secondary | ICD-10-CM | POA: Diagnosis not present

## 2023-08-30 DIAGNOSIS — M6281 Muscle weakness (generalized): Secondary | ICD-10-CM | POA: Diagnosis not present

## 2023-08-30 DIAGNOSIS — Z789 Other specified health status: Secondary | ICD-10-CM | POA: Diagnosis not present

## 2023-08-30 DIAGNOSIS — M25612 Stiffness of left shoulder, not elsewhere classified: Secondary | ICD-10-CM | POA: Diagnosis not present

## 2023-08-30 DIAGNOSIS — R531 Weakness: Secondary | ICD-10-CM | POA: Diagnosis not present

## 2023-08-30 DIAGNOSIS — M25512 Pain in left shoulder: Secondary | ICD-10-CM | POA: Diagnosis not present

## 2023-08-30 DIAGNOSIS — Z7409 Other reduced mobility: Secondary | ICD-10-CM | POA: Diagnosis not present

## 2023-08-30 DIAGNOSIS — Z79899 Other long term (current) drug therapy: Secondary | ICD-10-CM | POA: Diagnosis not present

## 2023-08-30 DIAGNOSIS — Z4789 Encounter for other orthopedic aftercare: Secondary | ICD-10-CM | POA: Diagnosis not present

## 2023-08-30 DIAGNOSIS — Z7982 Long term (current) use of aspirin: Secondary | ICD-10-CM | POA: Diagnosis not present

## 2023-08-31 DIAGNOSIS — Z4789 Encounter for other orthopedic aftercare: Secondary | ICD-10-CM | POA: Diagnosis not present

## 2023-09-08 DIAGNOSIS — M6281 Muscle weakness (generalized): Secondary | ICD-10-CM | POA: Diagnosis not present

## 2023-09-08 DIAGNOSIS — Z7409 Other reduced mobility: Secondary | ICD-10-CM | POA: Diagnosis not present

## 2023-09-08 DIAGNOSIS — Z4789 Encounter for other orthopedic aftercare: Secondary | ICD-10-CM | POA: Diagnosis not present

## 2023-09-08 DIAGNOSIS — Z7982 Long term (current) use of aspirin: Secondary | ICD-10-CM | POA: Diagnosis not present

## 2023-09-08 DIAGNOSIS — R531 Weakness: Secondary | ICD-10-CM | POA: Diagnosis not present

## 2023-09-08 DIAGNOSIS — M25612 Stiffness of left shoulder, not elsewhere classified: Secondary | ICD-10-CM | POA: Diagnosis not present

## 2023-09-08 DIAGNOSIS — M25512 Pain in left shoulder: Secondary | ICD-10-CM | POA: Diagnosis not present

## 2023-09-08 DIAGNOSIS — Z79899 Other long term (current) drug therapy: Secondary | ICD-10-CM | POA: Diagnosis not present

## 2023-09-08 DIAGNOSIS — Z789 Other specified health status: Secondary | ICD-10-CM | POA: Diagnosis not present

## 2023-09-18 DIAGNOSIS — J209 Acute bronchitis, unspecified: Secondary | ICD-10-CM | POA: Diagnosis not present

## 2023-10-13 DIAGNOSIS — S46012D Strain of muscle(s) and tendon(s) of the rotator cuff of left shoulder, subsequent encounter: Secondary | ICD-10-CM | POA: Diagnosis not present

## 2023-10-18 DIAGNOSIS — M10062 Idiopathic gout, left knee: Secondary | ICD-10-CM | POA: Diagnosis not present

## 2023-11-05 DIAGNOSIS — R059 Cough, unspecified: Secondary | ICD-10-CM | POA: Diagnosis not present

## 2023-12-09 DIAGNOSIS — R413 Other amnesia: Secondary | ICD-10-CM | POA: Diagnosis not present

## 2023-12-09 DIAGNOSIS — J309 Allergic rhinitis, unspecified: Secondary | ICD-10-CM | POA: Diagnosis not present

## 2023-12-09 DIAGNOSIS — Z8739 Personal history of other diseases of the musculoskeletal system and connective tissue: Secondary | ICD-10-CM | POA: Diagnosis not present

## 2023-12-09 DIAGNOSIS — I10 Essential (primary) hypertension: Secondary | ICD-10-CM | POA: Diagnosis not present

## 2023-12-09 DIAGNOSIS — M1A09X0 Idiopathic chronic gout, multiple sites, without tophus (tophi): Secondary | ICD-10-CM | POA: Diagnosis not present

## 2023-12-09 DIAGNOSIS — K219 Gastro-esophageal reflux disease without esophagitis: Secondary | ICD-10-CM | POA: Diagnosis not present

## 2023-12-09 DIAGNOSIS — N529 Male erectile dysfunction, unspecified: Secondary | ICD-10-CM | POA: Diagnosis not present

## 2023-12-09 DIAGNOSIS — E7800 Pure hypercholesterolemia, unspecified: Secondary | ICD-10-CM | POA: Diagnosis not present

## 2023-12-19 DIAGNOSIS — I10 Essential (primary) hypertension: Secondary | ICD-10-CM | POA: Diagnosis not present

## 2023-12-19 DIAGNOSIS — E7800 Pure hypercholesterolemia, unspecified: Secondary | ICD-10-CM | POA: Diagnosis not present

## 2023-12-22 DIAGNOSIS — I10 Essential (primary) hypertension: Secondary | ICD-10-CM | POA: Diagnosis not present

## 2023-12-29 DIAGNOSIS — H5203 Hypermetropia, bilateral: Secondary | ICD-10-CM | POA: Diagnosis not present

## 2023-12-29 DIAGNOSIS — H3540 Unspecified peripheral retinal degeneration: Secondary | ICD-10-CM | POA: Diagnosis not present

## 2023-12-29 DIAGNOSIS — Z961 Presence of intraocular lens: Secondary | ICD-10-CM | POA: Diagnosis not present

## 2024-01-19 DIAGNOSIS — I10 Essential (primary) hypertension: Secondary | ICD-10-CM | POA: Diagnosis not present

## 2024-01-19 DIAGNOSIS — E7800 Pure hypercholesterolemia, unspecified: Secondary | ICD-10-CM | POA: Diagnosis not present

## 2024-01-27 DIAGNOSIS — I10 Essential (primary) hypertension: Secondary | ICD-10-CM | POA: Diagnosis not present

## 2024-02-04 DIAGNOSIS — H524 Presbyopia: Secondary | ICD-10-CM | POA: Diagnosis not present

## 2024-02-19 DIAGNOSIS — I10 Essential (primary) hypertension: Secondary | ICD-10-CM | POA: Diagnosis not present

## 2024-02-19 DIAGNOSIS — E7800 Pure hypercholesterolemia, unspecified: Secondary | ICD-10-CM | POA: Diagnosis not present

## 2024-02-27 DIAGNOSIS — I10 Essential (primary) hypertension: Secondary | ICD-10-CM | POA: Diagnosis not present

## 2024-03-20 DIAGNOSIS — I10 Essential (primary) hypertension: Secondary | ICD-10-CM | POA: Diagnosis not present

## 2024-03-20 DIAGNOSIS — E7800 Pure hypercholesterolemia, unspecified: Secondary | ICD-10-CM | POA: Diagnosis not present

## 2024-04-20 DIAGNOSIS — E7800 Pure hypercholesterolemia, unspecified: Secondary | ICD-10-CM | POA: Diagnosis not present

## 2024-04-20 DIAGNOSIS — I10 Essential (primary) hypertension: Secondary | ICD-10-CM | POA: Diagnosis not present

## 2024-04-23 DIAGNOSIS — I10 Essential (primary) hypertension: Secondary | ICD-10-CM | POA: Diagnosis not present

## 2024-05-20 DIAGNOSIS — E7800 Pure hypercholesterolemia, unspecified: Secondary | ICD-10-CM | POA: Diagnosis not present

## 2024-05-20 DIAGNOSIS — I10 Essential (primary) hypertension: Secondary | ICD-10-CM | POA: Diagnosis not present
